# Patient Record
Sex: Female | Born: 1980 | Race: White | Hispanic: No | Marital: Married | Smoking: Former smoker
Health system: Southern US, Community
[De-identification: ages and names within clinical notes are randomized; demographics above are authoritative.]

## PROBLEM LIST (undated history)

## (undated) ENCOUNTER — Inpatient Hospital Stay (HOSPITAL_COMMUNITY): Payer: Self-pay

## (undated) DIAGNOSIS — I209 Angina pectoris, unspecified: Secondary | ICD-10-CM

## (undated) DIAGNOSIS — R51 Headache: Secondary | ICD-10-CM

## (undated) DIAGNOSIS — F419 Anxiety disorder, unspecified: Secondary | ICD-10-CM

## (undated) DIAGNOSIS — E282 Polycystic ovarian syndrome: Secondary | ICD-10-CM

## (undated) DIAGNOSIS — R519 Headache, unspecified: Secondary | ICD-10-CM

## (undated) DIAGNOSIS — Z8619 Personal history of other infectious and parasitic diseases: Secondary | ICD-10-CM

## (undated) DIAGNOSIS — F32A Depression, unspecified: Secondary | ICD-10-CM

## (undated) DIAGNOSIS — F329 Major depressive disorder, single episode, unspecified: Secondary | ICD-10-CM

## (undated) DIAGNOSIS — K589 Irritable bowel syndrome without diarrhea: Secondary | ICD-10-CM

## (undated) DIAGNOSIS — Z87448 Personal history of other diseases of urinary system: Secondary | ICD-10-CM

## (undated) DIAGNOSIS — O09529 Supervision of elderly multigravida, unspecified trimester: Secondary | ICD-10-CM

## (undated) HISTORY — DX: Irritable bowel syndrome, unspecified: K58.9

## (undated) HISTORY — DX: Personal history of other diseases of urinary system: Z87.448

## (undated) HISTORY — PX: NO PAST SURGERIES: SHX2092

## (undated) HISTORY — DX: Headache: R51

## (undated) HISTORY — DX: Headache, unspecified: R51.9

## (undated) HISTORY — DX: Supervision of elderly multigravida, unspecified trimester: O09.529

## (undated) HISTORY — DX: Personal history of other infectious and parasitic diseases: Z86.19

## (undated) HISTORY — DX: Polycystic ovarian syndrome: E28.2

---

## 2001-07-12 ENCOUNTER — Other Ambulatory Visit: Admission: RE | Admit: 2001-07-12 | Discharge: 2001-07-12 | Payer: Self-pay | Admitting: Obstetrics and Gynecology

## 2003-10-25 ENCOUNTER — Emergency Department (HOSPITAL_COMMUNITY): Admission: AD | Admit: 2003-10-25 | Discharge: 2003-10-25 | Payer: Self-pay | Admitting: Family Medicine

## 2003-12-10 ENCOUNTER — Emergency Department (HOSPITAL_COMMUNITY): Admission: EM | Admit: 2003-12-10 | Discharge: 2003-12-10 | Payer: Self-pay | Admitting: Emergency Medicine

## 2004-12-07 ENCOUNTER — Other Ambulatory Visit: Admission: RE | Admit: 2004-12-07 | Discharge: 2004-12-07 | Payer: Self-pay | Admitting: Obstetrics and Gynecology

## 2005-06-13 ENCOUNTER — Inpatient Hospital Stay (HOSPITAL_COMMUNITY): Admission: AD | Admit: 2005-06-13 | Discharge: 2005-06-13 | Payer: Self-pay | Admitting: Obstetrics and Gynecology

## 2005-06-19 ENCOUNTER — Inpatient Hospital Stay (HOSPITAL_COMMUNITY): Admission: AD | Admit: 2005-06-19 | Discharge: 2005-06-22 | Payer: Self-pay | Admitting: Obstetrics and Gynecology

## 2007-02-19 ENCOUNTER — Emergency Department (HOSPITAL_COMMUNITY): Admission: EM | Admit: 2007-02-19 | Discharge: 2007-02-19 | Payer: Self-pay | Admitting: Family Medicine

## 2007-02-20 ENCOUNTER — Emergency Department (HOSPITAL_COMMUNITY): Admission: EM | Admit: 2007-02-20 | Discharge: 2007-02-20 | Payer: Self-pay | Admitting: Emergency Medicine

## 2008-03-04 ENCOUNTER — Emergency Department (HOSPITAL_COMMUNITY): Admission: EM | Admit: 2008-03-04 | Discharge: 2008-03-04 | Payer: Self-pay | Admitting: Family Medicine

## 2009-02-02 ENCOUNTER — Emergency Department (HOSPITAL_COMMUNITY): Admission: EM | Admit: 2009-02-02 | Discharge: 2009-02-02 | Payer: Self-pay | Admitting: Emergency Medicine

## 2010-05-08 ENCOUNTER — Emergency Department (HOSPITAL_COMMUNITY): Admission: EM | Admit: 2010-05-08 | Discharge: 2010-05-08 | Payer: Self-pay | Admitting: Emergency Medicine

## 2010-10-14 LAB — POCT PREGNANCY, URINE: Preg Test, Ur: NEGATIVE

## 2010-11-07 LAB — CULTURE, ROUTINE-ABSCESS

## 2010-12-17 NOTE — Discharge Summary (Signed)
Kathleen [name], Kathleen [name]           ACCOUNT NO.:  [id number]   MEDICAL RECORD NO.:  [id number]          PATIENT TYPE:  INP   LOCATION:  9136                          FACILITY:  [facility]   PHYSICIAN:  Zenaida Niece, M.D.DATE OF BIRTH:  1980-08-14   DATE OF ADMISSION:  06/19/2005  DATE OF DISCHARGE:                                 DISCHARGE SUMMARY   ADMISSION DIAGNOSES:  1.  Intrauterine pregnancy at 39 weeks.  2.  Gestational hypertension.  3.  Hypokalemia.   DISCHARGE DIAGNOSES:  1.  Intrauterine pregnancy at 39 weeks.  2.  Gestational hypertension.  3.  Hypokalemia.   PROCEDURES:  On June 20, 2005, she had a spontaneous vaginal delivery.   HISTORY AND PHYSICAL:  This is a 30 year old white female gravida 2 para 0-0-  1-0 with an EGA of [redacted] weeks who presents with a complaint of regular  contractions. Initial evaluation revealed her to be 2+, 80, and -2, with  palpable membranes and regular contractions. Prenatal care complicated by  first trimester nausea and vomiting and a history of migraine headaches and  anxiety. She has also had recent elevated blood pressures with normal labs  and no other significant symptoms. Prenatal labs:  Blood type is O positive  with a negative antibody screen, RPR nonreactive, rubella equivocal,  hepatitis B surface antigen negative, HIV negative, gonorrhea and chlamydia  negative, triple screen is normal, 1-hour Glucola is 107, group B strep is  negative. Past OB history:  One spontaneous abortion. Past medical history:  Migraines and anxiety. Allergies to PHENOBARBITAL. Physical exam:  She is  afebrile with stable vital signs except for blood pressures 140s over 90s to  100. Fetal heart tracing is reactive with contractions every 2-3 minutes.  Abdomen is gravid and nontender. Cervix again, on Dr. Berenda Morale first  exam, she is 2+, 80, -2, and vertex.   HOSPITAL COURSE:  The patient was admitted and PIH labs were checked. The  only  significant abnormality on her labs was a hypokalemia of 2.5. She  continued to contract and on my first exam on the morning of November 20 she  was 4, 90, and 0, and amniotomy was performed for augmentation. She had also  previously been started on Pitocin for augmentation. She did receive an  epidural. She progressed slowly throughout the day but did eventually reach  complete, pushed well, and on the evening of November 20 had a vaginal  delivery of a viable female infant with Apgars of 7 and 9 that weighed 8  pounds 2 ounces. Placenta delivered spontaneous and two remnants of  membranes were also removed. She had a small laceration at 8 o'clock  repaired with 3-0 Vicryl. She had bilateral labial abrasions which were  hemostatic and not repaired and estimated blood loss was 500 cc. Postpartum  she had no significant complications. Predelivery hemoglobin 11.2,  postdelivery 10.0. Her hypokalemia persisted with a value of 2.3 on  postpartum day #1. She was then started on potassium replacement therapy,  but on postpartum day #2 potassium was still low at 2.7. The plan is to  continue potassium replacement  therapy and recheck this in a week. If her  hypokalemia persists, she will be referred to an endocrinologist for  evaluation. She was felt to be stable enough for discharge at this time.   DISCHARGE INSTRUCTIONS:  Regular diet, pelvic rest, follow up in 6 weeks.  She is also to follow up in 1 week to check potassium level. Medications are  over-the-counter ibuprofen as needed and K-Dur 20 mEq #2 p.o. b.i.d., and  she is given our discharge pamphlet.      Zenaida Niece, M.D.  Electronically Signed     TDM/MEDQ  D:  06/22/2005  T:  06/22/2005  Job:  [id number]

## 2011-04-29 LAB — CBC
Hemoglobin: 15.3 — ABNORMAL HIGH
MCV: 87.4
RBC: 5.21 — ABNORMAL HIGH
WBC: 7.9

## 2011-04-29 LAB — DIFFERENTIAL
Eosinophils Absolute: 0.4
Lymphs Abs: 1.8
Monocytes Absolute: 0.6
Monocytes Relative: 8
Neutrophils Relative %: 65

## 2011-05-16 LAB — I-STAT 8, (EC8 V) (CONVERTED LAB)
BUN: 6
Chloride: 107
Glucose, Bld: 69 — ABNORMAL LOW
Potassium: 4.2
pH, Ven: 7.346 — ABNORMAL HIGH

## 2011-05-16 LAB — DIFFERENTIAL
Basophils Absolute: 0.1
Basophils Relative: 1
Eosinophils Absolute: 0.2
Eosinophils Relative: 3
Lymphocytes Relative: 38
Lymphs Abs: 3.1
Monocytes Absolute: 0.5
Monocytes Relative: 6
Neutro Abs: 4.2
Neutrophils Relative %: 52

## 2011-05-16 LAB — SEDIMENTATION RATE: Sed Rate: 3

## 2011-05-16 LAB — POCT URINALYSIS DIP (DEVICE)
Protein, ur: NEGATIVE
Specific Gravity, Urine: 1.015
Urobilinogen, UA: 0.2

## 2011-05-16 LAB — CBC
HCT: 41.8
Hemoglobin: 14.2
MCHC: 33.9
MCV: 85.8
Platelets: 219
RBC: 4.88
RDW: 11.9
WBC: 8

## 2011-08-30 ENCOUNTER — Telehealth: Payer: Self-pay

## 2011-08-30 NOTE — Telephone Encounter (Signed)
.  [facility] PT WILL BE PICKING UP A COPY OF HER IMMUNIZATION RECORDS, WAS TOLD IT MAY BE 24 TO 48 HR.S PLEASE CALL PT AT [phone]

## 2014-04-22 ENCOUNTER — Ambulatory Visit: Payer: Self-pay

## 2015-02-12 LAB — OB RESULTS CONSOLE HIV ANTIBODY (ROUTINE TESTING): HIV: NONREACTIVE

## 2015-02-12 LAB — OB RESULTS CONSOLE RPR: RPR: NONREACTIVE

## 2015-02-12 LAB — OB RESULTS CONSOLE GC/CHLAMYDIA
CHLAMYDIA, DNA PROBE: NEGATIVE
Gonorrhea: NEGATIVE

## 2015-02-12 LAB — OB RESULTS CONSOLE RUBELLA ANTIBODY, IGM: RUBELLA: IMMUNE

## 2015-02-12 LAB — OB RESULTS CONSOLE ANTIBODY SCREEN: ANTIBODY SCREEN: NEGATIVE

## 2015-02-12 LAB — OB RESULTS CONSOLE ABO/RH: RH TYPE: POSITIVE

## 2015-02-12 LAB — OB RESULTS CONSOLE HEPATITIS B SURFACE ANTIGEN: HEP B S AG: NEGATIVE

## 2015-07-07 ENCOUNTER — Other Ambulatory Visit (HOSPITAL_COMMUNITY): Payer: Self-pay | Admitting: Obstetrics and Gynecology

## 2015-07-07 DIAGNOSIS — R1011 Right upper quadrant pain: Secondary | ICD-10-CM

## 2015-07-08 ENCOUNTER — Ambulatory Visit (HOSPITAL_COMMUNITY)
Admission: RE | Admit: 2015-07-08 | Discharge: 2015-07-08 | Disposition: A | Discharge status: Home / Self Care | Payer: Medicaid Other | Source: Ambulatory Visit | Attending: Obstetrics and Gynecology | Admitting: Obstetrics and Gynecology

## 2015-07-08 DIAGNOSIS — R1011 Right upper quadrant pain: Secondary | ICD-10-CM | POA: Diagnosis present

## 2015-07-08 DIAGNOSIS — Z3A30 30 weeks gestation of pregnancy: Secondary | ICD-10-CM | POA: Diagnosis not present

## 2015-07-08 DIAGNOSIS — O26893 Other specified pregnancy related conditions, third trimester: Secondary | ICD-10-CM | POA: Insufficient documentation

## 2015-08-02 NOTE — L&D Delivery Note (Signed)
Delivery Note At 6:43 PM a viable female was delivered via Vaginal, Spontaneous Delivery (Presentation: Left Occiput Anterior).  APGAR: 8, 9; weight pending.   Placenta status: Intact, Spontaneous.  Cord:  with the following complications: None.   Anesthesia: Epidural  Episiotomy: None Lacerations: Labial Suture Repair: none Est. Blood Loss (mL): 200  Mom to postpartum.  Baby to Couplet care / Skin to Skin.  Kahla Risdon D 09/10/2015, 7:04 PM

## 2015-08-14 ENCOUNTER — Encounter (HOSPITAL_COMMUNITY): Payer: Self-pay

## 2015-08-14 ENCOUNTER — Inpatient Hospital Stay (HOSPITAL_COMMUNITY)
Admission: AD | Admit: 2015-08-14 | Discharge: 2015-08-14 | Disposition: A | Discharge status: Home / Self Care | Payer: Medicaid Other | Source: Ambulatory Visit | Attending: Obstetrics and Gynecology | Admitting: Obstetrics and Gynecology

## 2015-08-14 ENCOUNTER — Inpatient Hospital Stay (HOSPITAL_COMMUNITY): Payer: Medicaid Other

## 2015-08-14 DIAGNOSIS — Z3A35 35 weeks gestation of pregnancy: Secondary | ICD-10-CM | POA: Insufficient documentation

## 2015-08-14 DIAGNOSIS — O9989 Other specified diseases and conditions complicating pregnancy, childbirth and the puerperium: Secondary | ICD-10-CM | POA: Diagnosis not present

## 2015-08-14 DIAGNOSIS — R0602 Shortness of breath: Secondary | ICD-10-CM | POA: Diagnosis not present

## 2015-08-14 DIAGNOSIS — R079 Chest pain, unspecified: Secondary | ICD-10-CM | POA: Insufficient documentation

## 2015-08-14 DIAGNOSIS — O26893 Other specified pregnancy related conditions, third trimester: Secondary | ICD-10-CM | POA: Diagnosis present

## 2015-08-14 DIAGNOSIS — R000 Tachycardia, unspecified: Secondary | ICD-10-CM | POA: Insufficient documentation

## 2015-08-14 HISTORY — DX: Anxiety disorder, unspecified: F41.9

## 2015-08-14 HISTORY — DX: Major depressive disorder, single episode, unspecified: F32.9

## 2015-08-14 HISTORY — DX: Angina pectoris, unspecified: I20.9

## 2015-08-14 HISTORY — DX: Depression, unspecified: F32.A

## 2015-08-14 LAB — URINALYSIS, ROUTINE W REFLEX MICROSCOPIC
BILIRUBIN URINE: NEGATIVE
GLUCOSE, UA: NEGATIVE mg/dL
HGB URINE DIPSTICK: NEGATIVE
Ketones, ur: NEGATIVE mg/dL
Leukocytes, UA: NEGATIVE
Nitrite: NEGATIVE
PH: 5.5 (ref 5.0–8.0)
Protein, ur: NEGATIVE mg/dL
SPECIFIC GRAVITY, URINE: 1.015 (ref 1.005–1.030)

## 2015-08-14 LAB — PROTEIN / CREATININE RATIO, URINE
CREATININE, URINE: 43 mg/dL
PROTEIN CREATININE RATIO: 0.14 mg/mg{creat} (ref 0.00–0.15)
TOTAL PROTEIN, URINE: 6 mg/dL

## 2015-08-14 LAB — COMPREHENSIVE METABOLIC PANEL
ALK PHOS: 137 U/L — AB (ref 38–126)
ALT: 12 U/L — ABNORMAL LOW (ref 14–54)
ANION GAP: 10 (ref 5–15)
AST: 22 U/L (ref 15–41)
Albumin: 3.2 g/dL — ABNORMAL LOW (ref 3.5–5.0)
BUN: 9 mg/dL (ref 6–20)
CALCIUM: 9 mg/dL (ref 8.9–10.3)
CHLORIDE: 108 mmol/L (ref 101–111)
CO2: 19 mmol/L — AB (ref 22–32)
Creatinine, Ser: 0.58 mg/dL (ref 0.44–1.00)
Glucose, Bld: 111 mg/dL — ABNORMAL HIGH (ref 65–99)
Potassium: 3.6 mmol/L (ref 3.5–5.1)
SODIUM: 137 mmol/L (ref 135–145)
Total Bilirubin: 0.3 mg/dL (ref 0.3–1.2)
Total Protein: 7.1 g/dL (ref 6.5–8.1)

## 2015-08-14 LAB — CBC WITH DIFFERENTIAL/PLATELET
Basophils Absolute: 0 10*3/uL (ref 0.0–0.1)
Basophils Relative: 0 %
EOS ABS: 0.1 10*3/uL (ref 0.0–0.7)
EOS PCT: 1 %
HCT: 32.1 % — ABNORMAL LOW (ref 36.0–46.0)
Hemoglobin: 10.5 g/dL — ABNORMAL LOW (ref 12.0–15.0)
LYMPHS ABS: 2.7 10*3/uL (ref 0.7–4.0)
Lymphocytes Relative: 24 %
MCH: 27.1 pg (ref 26.0–34.0)
MCHC: 32.7 g/dL (ref 30.0–36.0)
MCV: 82.7 fL (ref 78.0–100.0)
MONOS PCT: 5 %
Monocytes Absolute: 0.6 10*3/uL (ref 0.1–1.0)
Neutro Abs: 8 10*3/uL — ABNORMAL HIGH (ref 1.7–7.7)
Neutrophils Relative %: 70 %
PLATELETS: 243 10*3/uL (ref 150–400)
RBC: 3.88 MIL/uL (ref 3.87–5.11)
RDW: 13.3 % (ref 11.5–15.5)
WBC: 11.4 10*3/uL — ABNORMAL HIGH (ref 4.0–10.5)

## 2015-08-14 LAB — LACTATE DEHYDROGENASE: LDH: 131 U/L (ref 98–192)

## 2015-08-14 LAB — URIC ACID: Uric Acid, Serum: 3.5 mg/dL (ref 2.3–6.6)

## 2015-08-14 LAB — MRSA PCR SCREENING: MRSA by PCR: NEGATIVE

## 2015-08-14 MED ORDER — LABETALOL HCL 5 MG/ML IV SOLN: 20.0000 mg | INTRAVENOUS | Status: DC | PRN

## 2015-08-14 MED ORDER — LACTATED RINGERS IV BOLUS (SEPSIS)
500.0000 mL | Freq: Once | INTRAVENOUS | Status: AC
Start: 2015-08-14 — End: 2015-08-14
  Administered 2015-08-14: 1000 mL via INTRAVENOUS

## 2015-08-14 MED ORDER — IOHEXOL 350 MG/ML SOLN
100.0000 mL | Freq: Once | INTRAVENOUS | Status: AC | PRN
  Administered 2015-08-14: 100 mL via INTRAVENOUS

## 2015-08-14 MED ORDER — HYDRALAZINE HCL 20 MG/ML IJ SOLN: 10.0000 mg | Freq: Once | INTRAMUSCULAR | Status: DC | PRN

## 2015-08-14 NOTE — MAU Note (Signed)
Patient presents with SOB, chest tightness, rapid heart rate, patient checked heart rate was 130s.

## 2015-08-14 NOTE — Discharge Instructions (Signed)
Holter Monitoring A Holter monitor is a small device that is used to detect abnormal heart rhythms. It clips to your clothing and is connected by wires to flat, sticky disks (electrodes) that attach to your chest. It is worn continuously for 24-48 hours. HOME CARE INSTRUCTIONS  Wear your Holter monitor at all times, even while exercising and sleeping, for as long as directed by your health care provider.  Make sure that the Holter monitor is safely clipped to your clothing or close to your body as recommended by your health care provider.  Do not get the monitor or wires wet.  Do not put body lotion or moisturizer on your chest.  Keep your skin clean.  Keep a diary of your daily activities, such as walking and doing chores. If you feel that your heartbeat is abnormal or that your heart is fluttering or skipping a beat:  Record what you are doing when it happens.  Record what time of day the symptoms occur.  Return your Holter monitor as directed by your health care provider.  Keep all follow-up visits as directed by your health care provider. This is important. SEEK IMMEDIATE MEDICAL CARE IF:  You feel lightheaded or you faint.  You have trouble breathing.  You feel pain in your chest, upper arm, or jaw.  You feel sick to your stomach and your skin is pale, cool, or damp.  You heartbeat feels unusual or abnormal.   This information is not intended to replace advice given to you by your health care provider. Make sure you discuss any questions you have with your health care provider.   Document Released: 04/15/2004 Document Revised: 08/08/2014 Document Reviewed: 02/24/2014 Elsevier Interactive Patient Education 2016 [employer]  

## 2015-08-14 NOTE — MAU Provider Note (Signed)
  History     CSN: [id number]  Arrival date and time: 08/14/15 1646   First Provider Initiated Contact with Patient 08/14/15 1729      Chief Complaint  Patient presents with  . Tachycardia  . Chest Pain  . Shortness of Breath   HPI Kathleen [name] 35 y.o. G2P1001 @[redacted]w[redacted]d  presents to MAU complaining of increased heart rate and SOB.  She has been having episodes of heart racing several times a day for the past 3 days.  She checked her pulse today and found it to be 130s.  This comes on during periods of rest.  It occurs a few times each day lasting around 10-15 minutes each time although this time it seemed to go on for 30 minutes prior to her arrival.  She walks for an hour every day for exercise and it never occurs during that time.  She feels chest tightness with SOB but no other chest pain.  She called the office and was advised to come here.  She notes good fetal movements.  She denies HA, vision change, epigastric pain, swelling.   OB History    Gravida Para Term Preterm AB TAB SAB Ectopic Multiple Living   2 1 1       1       Past Medical History  Diagnosis Date  . Depression   . Anxiety   . Anginal pain (HCC)     History reviewed. No pertinent past surgical history.  History reviewed. No pertinent family history.  Social History  Substance Use Topics  . Smoking status: None  . Smokeless tobacco: None  . Alcohol Use: None    Allergies:  Allergies  Allergen Reactions  . Phenobarbital Hives    No prescriptions prior to admission    ROS Pertinent ROS in HPI.  All other systems are negative.   Physical Exam   Blood pressure 128/91, pulse 122, temperature 97.9 F (36.6 C), temperature source Oral, resp. rate 20, height 5\' 9"  (1.753 m), weight 162 lb 12.8 oz (73.846 kg), SpO2 100 %.  Physical Exam  Constitutional: She is oriented to person, place, and time. She appears well-developed and well-nourished. No distress.  HENT:  Head: Normocephalic and  atraumatic.  Eyes: Conjunctivae and EOM are normal.  Neck: Normal range of motion. Neck supple.  Cardiovascular: Regular rhythm and normal heart sounds.   Respiratory: Effort normal. No respiratory distress.  GI: Soft. Bowel sounds are normal. She exhibits no distension.  Musculoskeletal: Normal range of motion.  Neurological: She is alert and oriented to person, place, and time.  Skin: Skin is warm and dry.  Psychiatric: She has a normal mood and affect. Her behavior is normal.    MAU Course  Procedures  MDM EKG ordered - normal Labs ordered - normal without sign of anemia/dehydration/infection O2 Sats stable Discussed with Dr. Mindi SlickerBanga whom agreed to obtain CT to rule out PE CT negative for PE Pt feeling completely improved with no complaints whatsoever Dr. Mindi SlickerBanga agreeable to discharge with f/u in office  Assessment and Plan  A:  1. Shortness of breath during pregnancy   2. Tachycardia    P: Discharge to home F/u in clinic - may consider cardiology consult Patient may return to MAU as needed or if her condition were to change or worsen   Bertram DenverKaren E Teague Clark 08/14/2015, 5:29 PM

## 2015-08-18 LAB — OB RESULTS CONSOLE GBS: GBS: POSITIVE

## 2015-09-08 ENCOUNTER — Encounter (HOSPITAL_COMMUNITY): Payer: Self-pay | Admitting: *Deleted

## 2015-09-08 ENCOUNTER — Telehealth (HOSPITAL_COMMUNITY): Payer: Self-pay | Admitting: *Deleted

## 2015-09-08 NOTE — Telephone Encounter (Signed)
Preadmission screen  

## 2015-09-10 ENCOUNTER — Encounter (HOSPITAL_COMMUNITY): Payer: Self-pay

## 2015-09-10 ENCOUNTER — Inpatient Hospital Stay (HOSPITAL_COMMUNITY)
Admission: AD | Admit: 2015-09-10 | Discharge: 2015-09-12 | DRG: 775 | Disposition: A | Discharge status: Home / Self Care | Payer: Medicaid Other | Source: Ambulatory Visit | Attending: Obstetrics and Gynecology | Admitting: Obstetrics and Gynecology

## 2015-09-10 ENCOUNTER — Inpatient Hospital Stay (HOSPITAL_COMMUNITY): Payer: Medicaid Other | Admitting: Anesthesiology

## 2015-09-10 DIAGNOSIS — Z3A39 39 weeks gestation of pregnancy: Secondary | ICD-10-CM

## 2015-09-10 DIAGNOSIS — O99344 Other mental disorders complicating childbirth: Secondary | ICD-10-CM | POA: Diagnosis present

## 2015-09-10 DIAGNOSIS — F418 Other specified anxiety disorders: Secondary | ICD-10-CM | POA: Diagnosis present

## 2015-09-10 DIAGNOSIS — O99824 Streptococcus B carrier state complicating childbirth: Secondary | ICD-10-CM | POA: Diagnosis present

## 2015-09-10 DIAGNOSIS — Z87891 Personal history of nicotine dependence: Secondary | ICD-10-CM | POA: Diagnosis not present

## 2015-09-10 DIAGNOSIS — IMO0001 Reserved for inherently not codable concepts without codable children: Secondary | ICD-10-CM

## 2015-09-10 LAB — CBC
HEMATOCRIT: 35.4 % — AB (ref 36.0–46.0)
HEMOGLOBIN: 11.3 g/dL — AB (ref 12.0–15.0)
MCH: 26 pg (ref 26.0–34.0)
MCHC: 31.9 g/dL (ref 30.0–36.0)
MCV: 81.4 fL (ref 78.0–100.0)
Platelets: 240 10*3/uL (ref 150–400)
RBC: 4.35 MIL/uL (ref 3.87–5.11)
RDW: 14.5 % (ref 11.5–15.5)
WBC: 10.9 10*3/uL — ABNORMAL HIGH (ref 4.0–10.5)

## 2015-09-10 LAB — TYPE AND SCREEN
ABO/RH(D): O POS
ANTIBODY SCREEN: NEGATIVE

## 2015-09-10 LAB — MRSA PCR SCREENING: MRSA BY PCR: NEGATIVE

## 2015-09-10 LAB — ABO/RH: ABO/RH(D): O POS

## 2015-09-10 MED ORDER — ACETAMINOPHEN 325 MG PO TABS
650.0000 mg | ORAL_TABLET | ORAL | Status: DC | PRN
  Administered 2015-09-10 – 2015-09-11 (×2): 650 mg via ORAL
  Filled 2015-09-10 (×2): qty 2

## 2015-09-10 MED ORDER — PRENATAL MULTIVITAMIN CH
1.0000 | ORAL_TABLET | Freq: Every day | ORAL | Status: DC
  Administered 2015-09-11: 1 via ORAL
  Filled 2015-09-10: qty 1

## 2015-09-10 MED ORDER — LANOLIN HYDROUS EX OINT: TOPICAL_OINTMENT | CUTANEOUS | Status: DC | PRN

## 2015-09-10 MED ORDER — PHENYLEPHRINE 40 MCG/ML (10ML) SYRINGE FOR IV PUSH (FOR BLOOD PRESSURE SUPPORT)
80.0000 ug | PREFILLED_SYRINGE | INTRAVENOUS | Status: DC | PRN
  Filled 2015-09-10: qty 20
  Filled 2015-09-10: qty 2

## 2015-09-10 MED ORDER — LIDOCAINE HCL (PF) 1 % IJ SOLN
30.0000 mL | INTRAMUSCULAR | Status: DC | PRN
  Filled 2015-09-10: qty 30

## 2015-09-10 MED ORDER — OXYTOCIN BOLUS FROM INFUSION: 500.0000 mL | INTRAVENOUS | Status: DC

## 2015-09-10 MED ORDER — DIPHENHYDRAMINE HCL 50 MG/ML IJ SOLN: 12.5000 mg | INTRAMUSCULAR | Status: DC | PRN

## 2015-09-10 MED ORDER — METHYLERGONOVINE MALEATE 0.2 MG PO TABS: 0.2000 mg | ORAL_TABLET | ORAL | Status: DC | PRN

## 2015-09-10 MED ORDER — WITCH HAZEL-GLYCERIN EX PADS
1.0000 "application " | MEDICATED_PAD | CUTANEOUS | Status: DC | PRN
  Administered 2015-09-10: 1 via TOPICAL

## 2015-09-10 MED ORDER — OXYCODONE HCL 5 MG PO TABS: 5.0000 mg | ORAL_TABLET | ORAL | Status: DC | PRN

## 2015-09-10 MED ORDER — MEASLES, MUMPS & RUBELLA VAC ~~LOC~~ INJ: 0.5000 mL | INJECTION | Freq: Once | SUBCUTANEOUS | Status: DC

## 2015-09-10 MED ORDER — PENICILLIN G POTASSIUM 5000000 UNITS IJ SOLR
5.0000 10*6.[IU] | Freq: Once | INTRAVENOUS | Status: AC
  Administered 2015-09-10: 5 10*6.[IU] via INTRAVENOUS
  Filled 2015-09-10: qty 5

## 2015-09-10 MED ORDER — BENZOCAINE-MENTHOL 20-0.5 % EX AERO
1.0000 | INHALATION_SPRAY | CUTANEOUS | Status: DC | PRN
Start: 2015-09-10 — End: 2015-09-12
  Administered 2015-09-10 – 2015-09-11 (×2): 1 via TOPICAL
  Filled 2015-09-10 (×2): qty 56

## 2015-09-10 MED ORDER — METHYLERGONOVINE MALEATE 0.2 MG/ML IJ SOLN: 0.2000 mg | INTRAMUSCULAR | Status: DC | PRN

## 2015-09-10 MED ORDER — LACTATED RINGERS IV SOLN: 500.0000 mL | INTRAVENOUS | Status: DC | PRN

## 2015-09-10 MED ORDER — ONDANSETRON HCL 4 MG PO TABS
4.0000 mg | ORAL_TABLET | ORAL | Status: DC | PRN
Start: 2015-09-10 — End: 2015-09-12

## 2015-09-10 MED ORDER — DIBUCAINE 1 % RE OINT
1.0000 "application " | TOPICAL_OINTMENT | RECTAL | Status: DC | PRN
  Administered 2015-09-10: 1 via RECTAL
  Filled 2015-09-10: qty 28

## 2015-09-10 MED ORDER — CITRIC ACID-SODIUM CITRATE 334-500 MG/5ML PO SOLN: 30.0000 mL | ORAL | Status: DC | PRN

## 2015-09-10 MED ORDER — SENNOSIDES-DOCUSATE SODIUM 8.6-50 MG PO TABS
2.0000 | ORAL_TABLET | ORAL | Status: DC
  Administered 2015-09-10 – 2015-09-11 (×2): 2 via ORAL
  Filled 2015-09-10 (×2): qty 2

## 2015-09-10 MED ORDER — SODIUM BICARBONATE 8.4 % IV SOLN
INTRAVENOUS | Status: DC | PRN
  Administered 2015-09-10: 5 mL via EPIDURAL

## 2015-09-10 MED ORDER — EPHEDRINE 5 MG/ML INJ
10.0000 mg | INTRAVENOUS | Status: DC | PRN
  Filled 2015-09-10: qty 2

## 2015-09-10 MED ORDER — OXYCODONE HCL 5 MG PO TABS: 10.0000 mg | ORAL_TABLET | ORAL | Status: DC | PRN

## 2015-09-10 MED ORDER — IBUPROFEN 600 MG PO TABS
600.0000 mg | ORAL_TABLET | Freq: Four times a day (QID) | ORAL | Status: DC
  Administered 2015-09-10 – 2015-09-12 (×6): 600 mg via ORAL
  Filled 2015-09-10 (×6): qty 1

## 2015-09-10 MED ORDER — BUTORPHANOL TARTRATE 1 MG/ML IJ SOLN: 1.0000 mg | INTRAMUSCULAR | Status: DC | PRN

## 2015-09-10 MED ORDER — ZOLPIDEM TARTRATE 5 MG PO TABS: 5.0000 mg | ORAL_TABLET | Freq: Every evening | ORAL | Status: DC | PRN

## 2015-09-10 MED ORDER — LACTATED RINGERS IV SOLN
INTRAVENOUS | Status: DC
  Administered 2015-09-10 (×2): via INTRAVENOUS

## 2015-09-10 MED ORDER — LACTATED RINGERS IV SOLN
2.5000 [IU]/h | INTRAVENOUS | Status: DC
  Administered 2015-09-10: 2.5 [IU]/h via INTRAVENOUS
  Filled 2015-09-10: qty 4

## 2015-09-10 MED ORDER — SIMETHICONE 80 MG PO CHEW: 80.0000 mg | CHEWABLE_TABLET | ORAL | Status: DC | PRN

## 2015-09-10 MED ORDER — OXYCODONE-ACETAMINOPHEN 5-325 MG PO TABS: 2.0000 | ORAL_TABLET | ORAL | Status: DC | PRN

## 2015-09-10 MED ORDER — DIPHENHYDRAMINE HCL 25 MG PO CAPS: 25.0000 mg | ORAL_CAPSULE | Freq: Four times a day (QID) | ORAL | Status: DC | PRN

## 2015-09-10 MED ORDER — ONDANSETRON HCL 4 MG/2ML IJ SOLN: 4.0000 mg | INTRAMUSCULAR | Status: DC | PRN

## 2015-09-10 MED ORDER — MAGNESIUM HYDROXIDE 400 MG/5ML PO SUSP
30.0000 mL | ORAL | Status: DC | PRN
Start: 2015-09-10 — End: 2015-09-12

## 2015-09-10 MED ORDER — BUPROPION HCL ER (XL) 150 MG PO TB24
150.0000 mg | ORAL_TABLET | Freq: Every day | ORAL | Status: DC
  Administered 2015-09-11 – 2015-09-12 (×2): 150 mg via ORAL
  Filled 2015-09-10 (×2): qty 1

## 2015-09-10 MED ORDER — ONDANSETRON HCL 4 MG/2ML IJ SOLN: 4.0000 mg | Freq: Four times a day (QID) | INTRAMUSCULAR | Status: DC | PRN

## 2015-09-10 MED ORDER — PENICILLIN G POTASSIUM 5000000 UNITS IJ SOLR
2.5000 10*6.[IU] | INTRAVENOUS | Status: DC
  Administered 2015-09-10: 2.5 10*6.[IU] via INTRAVENOUS
  Filled 2015-09-10 (×4): qty 2.5

## 2015-09-10 MED ORDER — OXYCODONE-ACETAMINOPHEN 5-325 MG PO TABS: 1.0000 | ORAL_TABLET | ORAL | Status: DC | PRN

## 2015-09-10 MED ORDER — TETANUS-DIPHTH-ACELL PERTUSSIS 5-2.5-18.5 LF-MCG/0.5 IM SUSP: 0.5000 mL | Freq: Once | INTRAMUSCULAR | Status: DC

## 2015-09-10 MED ORDER — LIDOCAINE HCL (PF) 1 % IJ SOLN
INTRAMUSCULAR | Status: DC | PRN
  Administered 2015-09-10: 5 mL via EPIDURAL
  Administered 2015-09-10: 2 mL via EPIDURAL
  Administered 2015-09-10: 3 mL via EPIDURAL

## 2015-09-10 MED ORDER — FENTANYL 2.5 MCG/ML BUPIVACAINE 1/10 % EPIDURAL INFUSION (WH - ANES)
14.0000 mL/h | INTRAMUSCULAR | Status: DC | PRN
  Administered 2015-09-10: 14 mL/h via EPIDURAL
  Filled 2015-09-10: qty 125

## 2015-09-10 NOTE — Consults (Signed)
  Anesthesia Pain Consult Note  Patient: Kathleen [name], 35 y.o., female  Consult Requested by: Lavina Hamman, MD  Reason for Consult: CRNA Pain Rounds  Level of Consciousness: alert  Pain: 3 /10 , Pain GOAL: 6-7  Last Vitals:  Filed Vitals:   09/10/15 1142 09/10/15 1228  BP: 143/95 137/79  Pulse: 95 90  Temp:    Resp: 16 16    Plan: Epidural infusion for pain control.   Dineen Conradt L 09/10/2015

## 2015-09-10 NOTE — Anesthesia Procedure Notes (Signed)
Epidural Patient location during procedure: OB  Staffing Anesthesiologist: Jrake Rodriquez EDWARD Performed by: anesthesiologist   Preanesthetic Checklist Completed: patient identified, pre-op evaluation, timeout performed, IV checked, risks and benefits discussed and monitors and equipment checked  Epidural Patient position: sitting Prep: DuraPrep Patient monitoring: blood pressure and continuous pulse ox Approach: midline Location: L3-L4 Injection technique: LOR air  Needle:  Needle type: Tuohy  Needle gauge: 17 G Needle length: 9 cm Needle insertion depth: 4 cm Catheter size: 19 Gauge Catheter at skin depth: 9 cm Test dose: negative and Other (1% Lidocaine)  Additional Notes Patient identified.  Risk benefits discussed including failed block, incomplete pain control, headache, nerve damage, paralysis, blood pressure changes, nausea, vomiting, reactions to medication both toxic or allergic, and postpartum back pain.  Patient expressed understanding and wished to proceed.  All questions were answered.  Sterile technique used throughout procedure and epidural site dressed with sterile barrier dressing. No paresthesia or other complications noted. The patient did not experience any signs of intravascular injection such as tinnitus or metallic taste in mouth nor signs of intrathecal spread such as rapid motor block. Please see nursing notes for vital signs. Reason for block:procedure for pain   

## 2015-09-10 NOTE — Anesthesia Preprocedure Evaluation (Signed)
Anesthesia Evaluation  Patient identified by MRN, date of birth, ID band Patient awake    Reviewed: Allergy & Precautions, NPO status , Patient's Chart, lab work & pertinent test results  History of Anesthesia Complications Negative for: history of anesthetic complications  Airway Mallampati: II  TM Distance: >3 FB Neck ROM: Full    Dental  (+) Teeth Intact, Dental Advisory Given   Pulmonary former smoker,    Pulmonary exam normal breath sounds clear to auscultation       Cardiovascular Exercise Tolerance: Good negative cardio ROS Normal cardiovascular exam Rhythm:Regular Rate:Normal     Neuro/Psych  Headaches, PSYCHIATRIC DISORDERS Anxiety Depression    GI/Hepatic negative GI ROS, Neg liver ROS,   Endo/Other  negative endocrine ROS  Renal/GU negative Renal ROS     Musculoskeletal negative musculoskeletal ROS (+)   Abdominal   Peds  Hematology negative hematology ROS (+)   Anesthesia Other Findings Day of surgery medications reviewed with the patient.  Reproductive/Obstetrics (+) Pregnancy                             Anesthesia Physical Anesthesia Plan  ASA: II  Anesthesia Plan: Epidural   Post-op Pain Management:    Induction:   Airway Management Planned:   Additional Equipment:   Intra-op Plan:   Post-operative Plan:   Informed Consent: I have reviewed the patients History and Physical, chart, labs and discussed the procedure including the risks, benefits and alternatives for the proposed anesthesia with the patient or authorized representative who has indicated his/her understanding and acceptance.   Dental advisory given  Plan Discussed with:   Anesthesia Plan Comments: (Patient identified. Risks/Benefits/Options discussed with patient including but not limited to bleeding, infection, nerve damage, paralysis, failed block, incomplete pain control, headache, blood  pressure changes, nausea, vomiting, reactions to medication both or allergic, itching and postpartum back pain. Confirmed with bedside nurse the patient's most recent platelet count. Confirmed with patient that they are not currently taking any anticoagulation, have any bleeding history or any family history of bleeding disorders. Patient expressed understanding and wished to proceed. All questions were answered. )        Anesthesia Quick Evaluation

## 2015-09-10 NOTE — H&P (Signed)
[name] is a 35 y.o. female, G3 P1011, EGA 39+ weeks with EDC 2-12 presenting for labor.  Seen in the office with ctx, last VE 1 cm, 3/70/-2 today.  Prenatal care uncomplicated.  Maternal Medical History:  Reason for admission: Contractions.   Contractions: Frequency: regular.   Perceived severity is moderate.    Fetal activity: Perceived fetal activity is normal.      OB History    Gravida Para Term Preterm AB TAB SAB Ectopic Multiple Living   SVD at term, [facility]  Past Medical History  Diagnosis Date  . Depression   . Anxiety   . Anginal pain (HCC)   . AMA (advanced maternal age) multigravida 35+   . Hx of pyelonephritis   . Headache   . Hx of varicella   . IBS (irritable bowel syndrome)   . PCOS (polycystic ovarian syndrome)    Past Surgical History  Procedure Laterality Date  . No past surgeries     Family History: family history includes Asthma in her maternal aunt; Heart disease in her paternal grandmother. Social History:  reports that she quit smoking about 5 years ago. She does not have any smokeless tobacco history on file. She reports that she does not drink alcohol. Her drug history is not on file.   Prenatal Transfer Tool  Maternal Diabetes: No Genetic Screening: Normal Maternal Ultrasounds/Referrals: Normal Fetal Ultrasounds or other Referrals:  None Maternal Substance Abuse:  No Significant Maternal Medications:  None Significant Maternal Lab Results:  Lab values include: Group B Strep positive Other Comments:  None  Review of Systems  Respiratory: Negative.   Cardiovascular: Negative.    AROM clear Dilation: 3.5 Effacement (%): 80 Station: -2 Exam by:: Calen Geister, T, MD Blood pressure 137/79, pulse 90, temperature 98.4 F (36.9 C), temperature source Axillary, resp. rate 16, height  (1.753 m), weight 77.111 kg (170 lb). Maternal Exam:  Uterine Assessment: Contraction strength is moderate.  Contraction frequency  is regular.   Abdomen: Patient reports no abdominal tenderness. Estimated fetal weight is 7 1/2 lbs.   Fetal presentation: vertex  Introitus: Normal vulva. Normal vagina.  Amniotic fluid character: clear.  Pelvis: adequate for delivery.   Cervix: Cervix evaluated by digital exam.     Fetal Exam Fetal Monitor Review: Mode: ultrasound.   Variability: moderate (6-25 bpm).   Pattern: accelerations present and no decelerations.    Fetal State Assessment: Category I - tracings are normal.     Physical Exam  Vitals reviewed. Constitutional: She appears well-developed and well-nourished.  Cardiovascular: Normal rate, regular rhythm and normal heart sounds.   No murmur heard. Respiratory: Effort normal. No respiratory distress. She has no wheezes.  GI: Soft.    Prenatal labs: ABO, Rh: --/--/O POS (02/09 1020) Antibody: NEG (02/09 1020) Rubella: Immune (07/14 0000) RPR: Nonreactive (07/14 0000)  HBsAg: Negative (07/14 0000)  HIV: Non-reactive (07/14 0000)  GBS: Positive (01/17 0000)   Assessment/Plan: IUP at 39+ weeks in early labor, +GBS.  On PCN, AROM done for augmentation, monitor progress, anticipate SVD.   Kathleen [name] D 09/10/2015, 12:55 PM

## 2015-09-11 LAB — RPR: RPR: NONREACTIVE

## 2015-09-11 NOTE — Progress Notes (Signed)
MOB was referred for history of depression/anxiety.  Referral is screened out by Clinical Social Worker because none of the following criteria appear to apply: -History of anxiety/depression during this pregnancy, or of post-partum depression. - Diagnosis of anxiety and/or depression within last 3 years or -MOB's symptoms are currently being treated with medication and/or therapy.  Please contact the Clinical Social Worker if needs arise or upon MOB request.  

## 2015-09-11 NOTE — Lactation Note (Signed)
This note was copied from a baby's chart. Lactation Consultation Note  Patient Name: Kathleen [name] [id number] Date: 09/11/2015 Reason for consult: Initial assessment   Initial consult with first time BF mom of 21 hour old infant. Infant with 8 BF for 10-30 minutes, 2 voids, and 3 stools since birth. Infant 8 lb 6.6 oz and GA 39w 4d. LATCH Scores 7-8 by bedside RN. Mom reports that she has some nipple soreness with initial latch that improves with feeding discussed using good support with feeding and importance of obtaining and maintaining deep latch throughout feeding.  Mom reports infant has been cluster feeding, discussed NL NB Feeding behavior and NB Nutritional needs. Enc mom to feed 8-12 x in 24 hours at first feeding cues. Mom has been taught to hand express by bedside RN, advised mom the importance of hand expression. Reviewed BF Basics and positioning with mom. [facility] Brochure given, informed of LC Phone #, BF Support Groups and OP Services. Gave mom BF Resources Handout to mom. Mom to call insurance company to see if they will provide a pump for mom. Mom was asking about pumping to offer bottles at a later date, enc mom to BF for a few weeks prior to pumping unless medically indicated. Enc mom to call out to desk for questions/concerns/assistance as needed.   Maternal Data Formula Feeding for Exclusion: No Has patient been taught Hand Expression?: Yes (by bedside RN) Does the patient have breastfeeding experience prior to this delivery?: No (tried a few days with first child)  Feeding    LATCH Score/Interventions                      Lactation Tools Discussed/Used WIC Program: No   Consult Status Consult Status: Follow-up Date: 09/12/15 Follow-up type: In-patient    Silas Flood Hice 09/11/2015, 4:07 PM

## 2015-09-11 NOTE — Progress Notes (Signed)
PPD #1 No problems Afeb, VSS Fundus firm, NT at U-1 Continue routine postpartum care 

## 2015-09-11 NOTE — Anesthesia Postprocedure Evaluation (Signed)
Anesthesia Post Note  Patient: Kathleen [name]  Procedure(s) Performed: * No procedures listed *  Patient location during evaluation: Mother Baby Anesthesia Type: Epidural Level of consciousness: awake and alert and oriented Pain management: pain level controlled Vital Signs Assessment: post-procedure vital signs reviewed and stable Respiratory status: spontaneous breathing Cardiovascular status: blood pressure returned to baseline Postop Assessment: no headache, no backache, patient able to bend at knees, no signs of nausea or vomiting and adequate PO intake Anesthetic complications: no    Last Vitals:  Filed Vitals:   09/10/15 2135 09/11/15 0515  BP: 132/82 110/66  Pulse: 99 91  Temp: 37.1 C 36.6 C  Resp: 18 16    Last Pain:  Filed Vitals:   09/11/15 0740  PainSc: 3                  Brisha Mccabe

## 2015-09-12 LAB — CCBB MATERNAL DONOR DRAW

## 2015-09-12 MED ORDER — BUPROPION HCL ER (XL) 150 MG PO TB24
150.0000 mg | ORAL_TABLET | Freq: Two times a day (BID) | ORAL | Status: DC
Start: 2015-09-12 — End: 2024-01-06

## 2015-09-12 MED ORDER — IBUPROFEN 600 MG PO TABS: 600.0000 mg | ORAL_TABLET | Freq: Four times a day (QID) | ORAL | Status: DC

## 2015-09-12 NOTE — Progress Notes (Addendum)
Post Partum Day 2 Subjective: no complaints and tolerating PO  Pt had pp depression and wants to increase her wellbutrin.  Objective: Blood pressure 131/78, pulse 79, temperature 97.7 F (36.5 C), temperature source Oral, resp. rate 20, height  (1.753 m), weight 77.111 kg (170 lb), SpO2 99 %, unknown if currently breastfeeding.  Physical Exam:  General: alert and cooperative Lochia: appropriate Uterine Fundus: firm    Recent Labs  09/10/15 1020  HGB 11.3*  HCT 35.4*    Assessment/Plan: Discharge home  Motrin Increase wellbutrin to  po BID   LOS: 2 days   Donnah Levert W 09/12/2015, 9:18 AM

## 2015-09-12 NOTE — Lactation Note (Signed)
This note was copied from a baby's chart. Lactation Consultation Note  Denies questions for lactation consultant.  Patient Name: Kathleen [name] ZOXWR'[id number] Date: 09/12/2015     Maternal Data    Feeding Feeding Type: Breast Fed Length of feed: 30 min  LATCH Score/Interventions Latch: Grasps breast easily, tongue down, lips flanged, rhythmical sucking. Intervention(s): Assist with latch  Audible Swallowing: A few with stimulation  Type of Nipple: Everted at rest and after stimulation  Comfort (Breast/Nipple): Filling, red/small blisters or bruises, mild/mod discomfort  Problem noted: Mild/Moderate discomfort  Hold (Positioning): Assistance needed to correctly position infant at breast and maintain latch.  LATCH Score: 7  Lactation Tools Discussed/Used     Consult Status      Soyla Dryer 09/12/2015, 10:56 AM

## 2015-09-12 NOTE — Discharge Summary (Signed)
Obstetric Discharge Summary Reason for Admission: onset of labor Prenatal Procedures: none Intrapartum Procedures: spontaneous vaginal delivery, GBS prophylaxis Postpartum Procedures: none small labial lacerations not requiring repair Complications-Operative and Postpartum: none HEMOGLOBIN  Date Value Ref Range Status  09/10/2015 11.3* 12.0 - 15.0 g/dL Final   HCT  Date Value Ref Range Status  09/10/2015 35.4* 36.0 - 46.0 % Final    Physical Exam:  General: alert and cooperative Lochia: appropriate Uterine Fundus: firm   Discharge Diagnoses: Term Pregnancy-delivered  Discharge Information: Date: 09/12/2015 Activity: pelvic rest Diet: routine Medications: Ibuprofen and wellbutrin Condition: improved Instructions: refer to practice specific booklet Discharge to: home Follow-up Information    Follow up with MEISINGER,TODD D, MD. Schedule an appointment as soon as possible for a visit in 6 weeks.   Specialty:  Obstetrics and Gynecology   Why:  postpartum   Contact information:   [address], [address] [place] [address] [phone]       Newborn Data: Live born female  Birth Weight: 8 lb 7.1 oz (3830 g) APGAR: 8, 9  Home with mother.  Kathleen [name] 09/12/2015, 8:46 AM

## 2015-09-16 ENCOUNTER — Inpatient Hospital Stay (HOSPITAL_COMMUNITY): Admission: RE | Admit: 2015-09-16 | Payer: Medicaid Other | Source: Ambulatory Visit

## 2016-04-10 IMAGING — CT CT ANGIO CHEST
1 of 2 series · 19 of 32 positions shown · IV contrast (OMNIPAQUE)
Comparison: None.

CLINICAL DATA: Short of breath, tachycardia, 35 weeks pregnant.

EXAM:
CT ANGIOGRAPHY CHEST WITH CONTRAST
TECHNIQUE: Multidetector CT imaging of the chest was performed using the
standard protocol during bolus administration of intravenous
contrast. Multiplanar CT image reconstructions and MIPs were
obtained to evaluate the vascular anatomy.
CONTRAST:  100mL OMNIPAQUE IOHEXOL 350 MG/ML SOLN

[Series 5: (person_name) thins · axial · 0.63mm/px · z∈[+924,+1192]mm · 19 of 426 slices shown]
[im 22/426  lung]
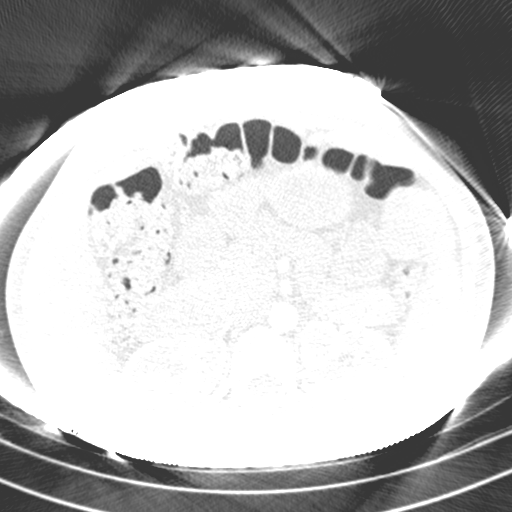
[im 43/426  mediastinal]
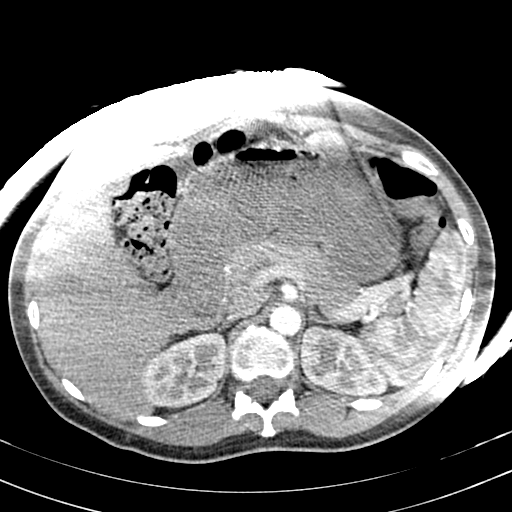
[im 64/426  lung]
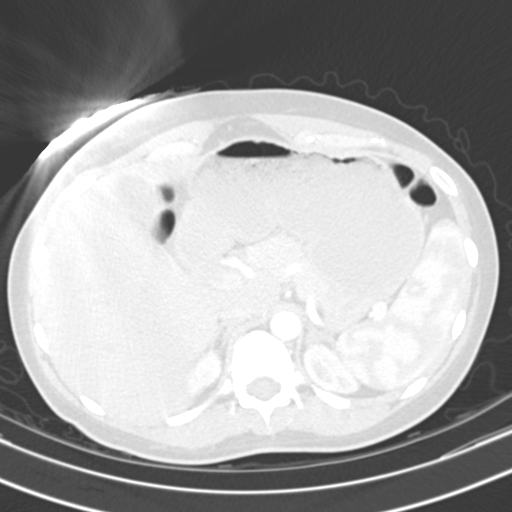
[im 107/426  mediastinal]
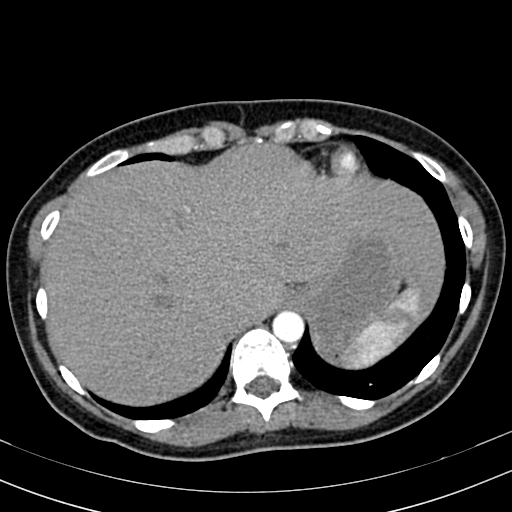
[im 128/426  lung]
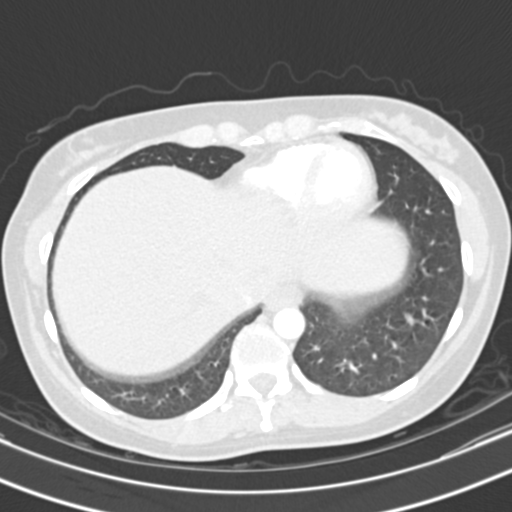
[im 142/426  mediastinal]
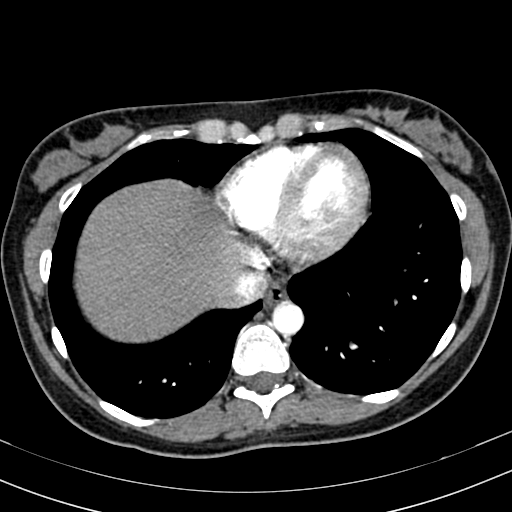
[im 149/426  lung]
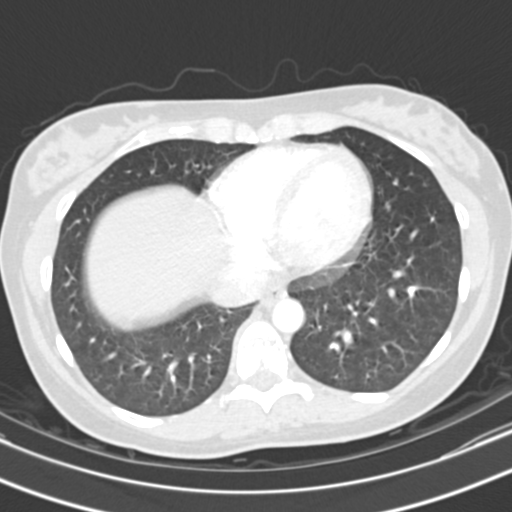
[im 171/426  mediastinal]
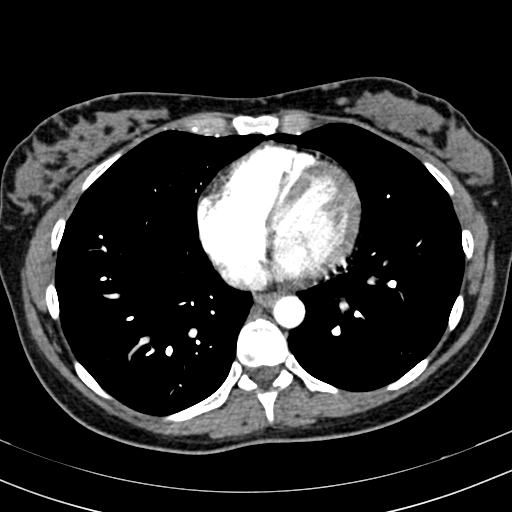
[im 192/426  lung]
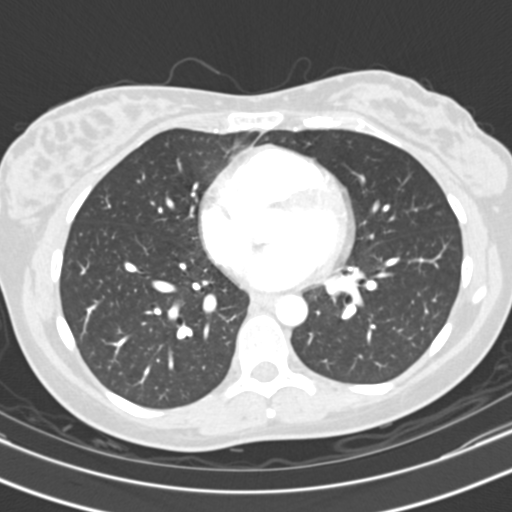
[im 213/426  mediastinal]
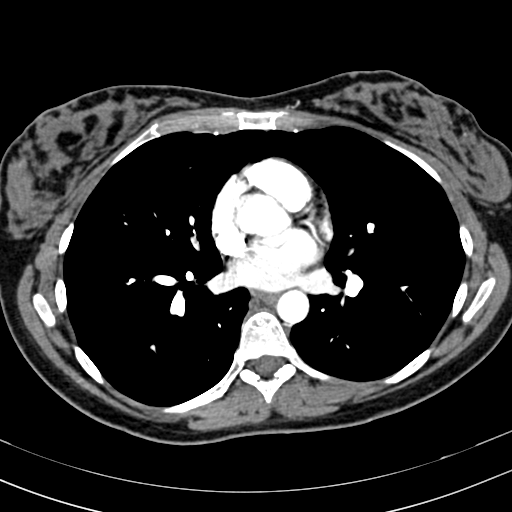
[im 234/426  lung]
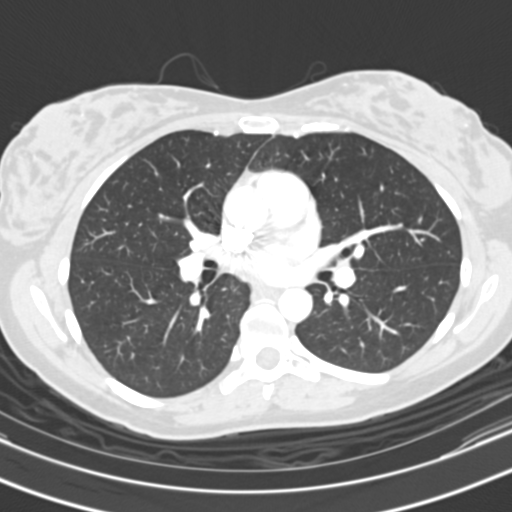
[im 256/426  mediastinal]
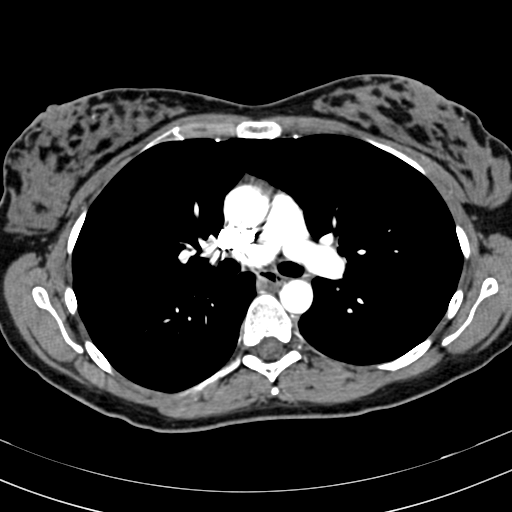
[im 277/426  lung]
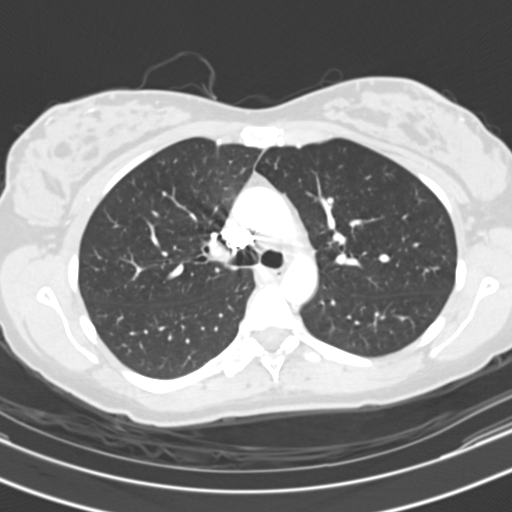
[im 284/426  mediastinal]
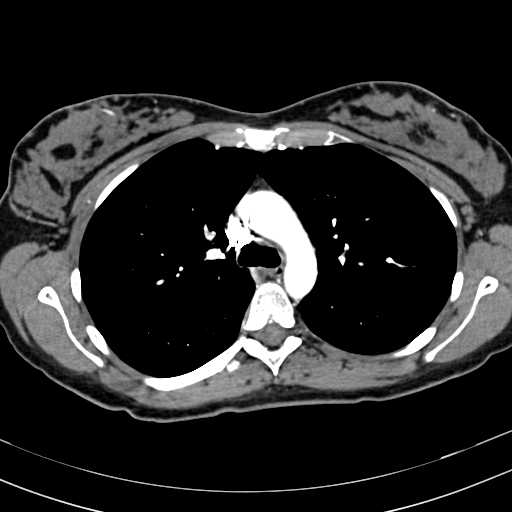
[im 298/426  lung]
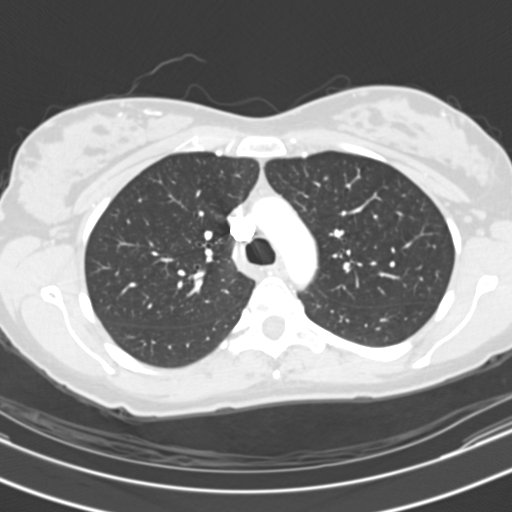
[im 319/426  mediastinal]
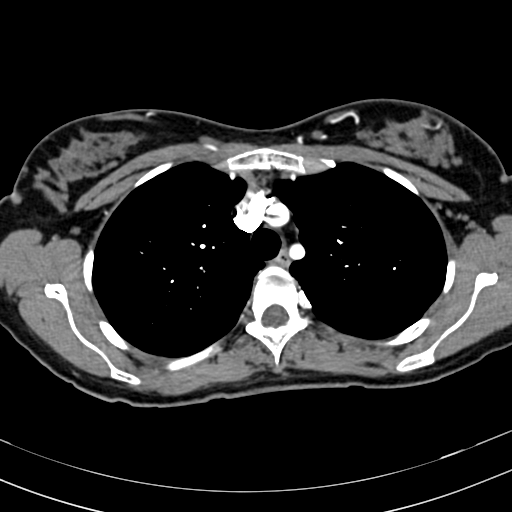
[im 362/426  lung]
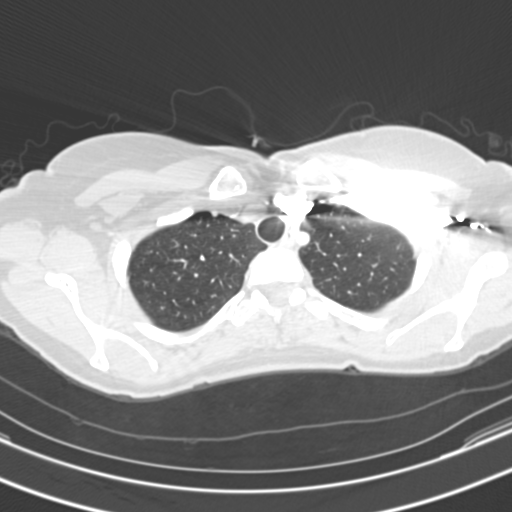
[im 383/426  mediastinal]
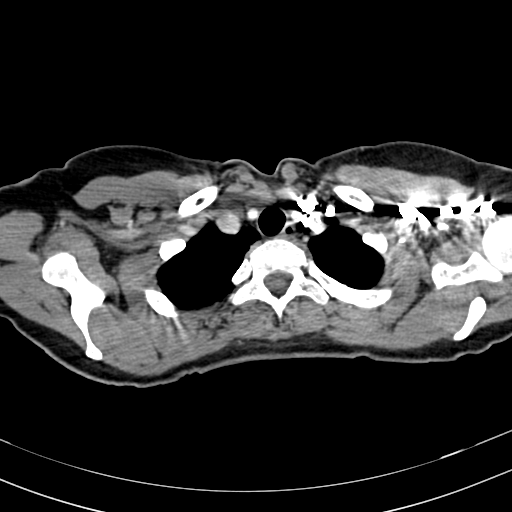
[im 404/426  lung]
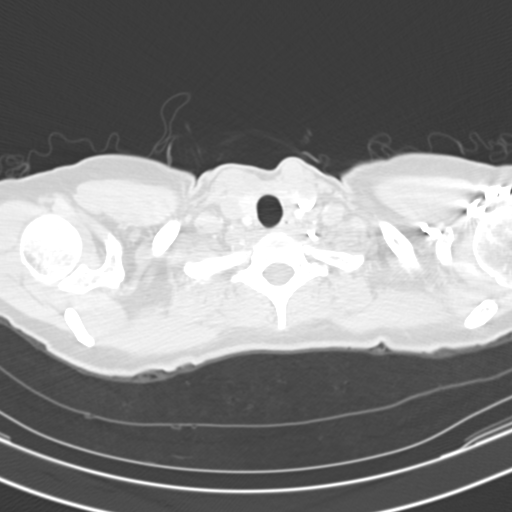

[19 of 32 positions shown; findings below may reference images not displayed]

FINDINGS: Mediastinum/Nodes: No filling defects within pulmonary suggest acute
pulmonary embolism. No acute findings aorta great vessels. No
pericardial fluid.

No axillary or supraclavicular lymphadenopathy. No mediastinal hilar
lymphadenopathy. No pericardial fluid. Esophagus normal.

Lungs/Pleura: No pulmonary infarction. No airspace disease. No
pleural fluid. No pulmonary edema. No pneumothorax.

Upper abdomen: Limited view of the liver, kidneys, pancreas are
unremarkable. Normal adrenal glands.

Musculoskeletal: No acute osseous abnormality.

Review of the MIP images confirms the above findings.
IMPRESSION: 1. No acute pulmonary embolism.
2. No acute pulmonary parenchymal findings.

## 2017-08-01 NOTE — L&D Delivery Note (Addendum)
Delivery Note  Pt reported sudden discomfort with contraction. When checked she was complete and +2. She pushed with two contractions. At 5:00 PM a viable female was delivered via Vaginal, Spontaneous (Presentation:LOA with compound right arm presentation ;  ).  APGAR: 8, 9; weight pending  .   Placenta status:delivered intact, schultz , .  Cord: 3vc  with the following complications: none.  Cord pH: n/a  Anesthesia:  Epidural Episiotomy: None Lacerations: None Suture Repair: n/a Est. Blood Loss (mL): 450  Mom to postpartum.  Baby to Couplet care / Skin to Skin  Offered pt BTL later tonight at 830pm or at noon tomorrow but pt would prefer to schedule interval   Cathrine Muster 05/07/2018, 5:51 PM

## 2017-10-16 LAB — OB RESULTS CONSOLE ABO/RH: RH TYPE: POSITIVE

## 2017-10-16 LAB — OB RESULTS CONSOLE RUBELLA ANTIBODY, IGM: Rubella: IMMUNE

## 2017-10-16 LAB — OB RESULTS CONSOLE GC/CHLAMYDIA
Chlamydia: NEGATIVE
Gonorrhea: NEGATIVE

## 2017-10-16 LAB — OB RESULTS CONSOLE HEPATITIS B SURFACE ANTIGEN: Hepatitis B Surface Ag: NEGATIVE

## 2017-10-16 LAB — OB RESULTS CONSOLE ANTIBODY SCREEN: Antibody Screen: NEGATIVE

## 2017-10-16 LAB — OB RESULTS CONSOLE HIV ANTIBODY (ROUTINE TESTING): HIV: NONREACTIVE

## 2017-10-16 LAB — OB RESULTS CONSOLE RPR: RPR: NONREACTIVE

## 2018-04-16 LAB — OB RESULTS CONSOLE GBS: STREP GROUP B AG: POSITIVE

## 2018-04-27 ENCOUNTER — Encounter (HOSPITAL_COMMUNITY): Payer: Self-pay | Admitting: *Deleted

## 2018-04-27 ENCOUNTER — Telehealth (HOSPITAL_COMMUNITY): Payer: Self-pay | Admitting: *Deleted

## 2018-04-27 NOTE — Telephone Encounter (Signed)
Preadmission screen  

## 2018-04-28 ENCOUNTER — Other Ambulatory Visit: Payer: Self-pay

## 2018-04-28 ENCOUNTER — Inpatient Hospital Stay (HOSPITAL_COMMUNITY)
Admission: AD | Admit: 2018-04-28 | Discharge: 2018-04-28 | Disposition: A | Discharge status: Home / Self Care | Payer: BLUE CROSS/BLUE SHIELD | Source: Ambulatory Visit | Attending: Obstetrics and Gynecology | Admitting: Obstetrics and Gynecology

## 2018-04-28 ENCOUNTER — Encounter (HOSPITAL_COMMUNITY): Payer: Self-pay

## 2018-04-28 DIAGNOSIS — O471 False labor at or after 37 completed weeks of gestation: Secondary | ICD-10-CM | POA: Diagnosis not present

## 2018-04-28 DIAGNOSIS — Z3A39 39 weeks gestation of pregnancy: Secondary | ICD-10-CM | POA: Insufficient documentation

## 2018-04-28 NOTE — MAU Note (Signed)
Pt. Reports ctx 5-6 min apart rated 4/10 that began at 1600 and became consistent at 1800. Denies leaking or bleeding. +FM

## 2018-04-28 NOTE — MAU Note (Signed)
I have communicated with Meisinger, MD and reviewed vital signs:  Vitals:   04/28/18 2111 04/28/18 2130  BP: (!) 142/82 (!) 141/78  Pulse: 88 84  Resp: 18   Temp: 98.7 F (37.1 C)     Vaginal exam:  Dilation: 1.5 Effacement (%): 50 Station: -3 Presentation: Vertex Exam by:: [employer], RN,   Also reviewed contraction pattern and that non-stress test is reactive.  It has been documented that patient is contracting irreggularly with minimal cervical change over 2 weeks not indicating active labor.  Patient denies any other complaints.  Based on this report provider has given order for discharge.  A discharge order and diagnosis entered by a provider.   Labor discharge instructions reviewed with patient.

## 2018-04-28 NOTE — Discharge Instructions (Signed)
Braxton Hicks Contractions °Contractions of the uterus can occur throughout pregnancy, but they are not always a sign that you are in labor. You may have practice contractions called Braxton Hicks contractions. These false labor contractions are sometimes confused with true labor. °What are Braxton Hicks contractions? °Braxton Hicks contractions are tightening movements that occur in the muscles of the uterus before labor. Unlike true labor contractions, these contractions do not result in opening (dilation) and thinning of the cervix. Toward the end of pregnancy (32-34 weeks), Braxton Hicks contractions can happen more often and may become stronger. These contractions are sometimes difficult to tell apart from true labor because they can be very uncomfortable. You should not feel embarrassed if you go to the hospital with false labor. °Sometimes, the only way to tell if you are in true labor is for your health care provider to look for changes in the cervix. The health care provider will do a physical exam and may monitor your contractions. If you are not in true labor, the exam should show that your cervix is not dilating and your water has not broken. °If there are other health problems associated with your pregnancy, it is completely safe for you to be sent home with false labor. You may continue to have Braxton Hicks contractions until you go into true labor. °How to tell the difference between true labor and false labor °True labor °· Contractions last 30-70 seconds. °· Contractions become very regular. °· Discomfort is usually felt in the top of the uterus, and it spreads to the lower abdomen and low back. °· Contractions do not go away with walking. °· Contractions usually become more intense and increase in frequency. °· The cervix dilates and gets thinner. °False labor °· Contractions are usually shorter and not as strong as true labor contractions. °· Contractions are usually irregular. °· Contractions  are often felt in the front of the lower abdomen and in the groin. °· Contractions may go away when you walk around or change positions while lying down. °· Contractions get weaker and are shorter-lasting as time goes on. °· The cervix usually does not dilate or become thin. °Follow these instructions at home: °· Take over-the-counter and prescription medicines only as told by your health care provider. °· Keep up with your usual exercises and follow other instructions from your health care provider. °· Eat and drink lightly if you think you are going into labor. °· If Braxton Hicks contractions are making you uncomfortable: °? Change your position from lying down or resting to walking, or change from walking to resting. °? Sit and rest in a tub of warm water. °? Drink enough fluid to keep your urine pale yellow. Dehydration may cause these contractions. °? Do slow and deep breathing several times an hour. °· Keep all follow-up prenatal visits as told by your health care provider. This is important. °Contact a health care provider if: °· You have a fever. °· You have continuous pain in your abdomen. °Get help right away if: °· Your contractions become stronger, more regular, and closer together. °· You have fluid leaking or gushing from your vagina. °· You pass blood-tinged mucus (bloody show). °· You have bleeding from your vagina. °· You have low back pain that you never had before. °· You feel your baby’s head pushing down and causing pelvic pressure. °· Your baby is not moving inside you as much as it used to. °Summary °· Contractions that occur before labor are called Braxton   Hicks contractions, false labor, or practice contractions. °· Braxton Hicks contractions are usually shorter, weaker, farther apart, and less regular than true labor contractions. True labor contractions usually become progressively stronger and regular and they become more frequent. °· Manage discomfort from Braxton Hicks contractions by  changing position, resting in a warm bath, drinking plenty of water, or practicing deep breathing. °This information is not intended to replace advice given to you by your health care provider. Make sure you discuss any questions you have with your health care provider. °Document Released: 12/01/2016 Document Revised: 12/01/2016 Document Reviewed: 12/01/2016 °Elsevier Interactive Patient Education © 2018 [employer] ° °

## 2018-05-02 ENCOUNTER — Telehealth (HOSPITAL_COMMUNITY): Payer: Self-pay | Admitting: *Deleted

## 2018-05-02 NOTE — Telephone Encounter (Signed)
Preadmission screen  

## 2018-05-07 ENCOUNTER — Encounter (HOSPITAL_COMMUNITY): Payer: Self-pay

## 2018-05-07 ENCOUNTER — Inpatient Hospital Stay (HOSPITAL_COMMUNITY): Payer: BLUE CROSS/BLUE SHIELD | Admitting: Anesthesiology

## 2018-05-07 ENCOUNTER — Inpatient Hospital Stay (HOSPITAL_COMMUNITY)
Admission: RE | Admit: 2018-05-07 | Discharge: 2018-05-08 | DRG: 807 | Disposition: A | Discharge status: Home / Self Care | Payer: BLUE CROSS/BLUE SHIELD | Attending: Obstetrics and Gynecology | Admitting: Obstetrics and Gynecology

## 2018-05-07 DIAGNOSIS — Z3A39 39 weeks gestation of pregnancy: Secondary | ICD-10-CM

## 2018-05-07 DIAGNOSIS — O322XX0 Maternal care for transverse and oblique lie, not applicable or unspecified: Secondary | ICD-10-CM | POA: Diagnosis present

## 2018-05-07 DIAGNOSIS — Z3490 Encounter for supervision of normal pregnancy, unspecified, unspecified trimester: Secondary | ICD-10-CM | POA: Diagnosis present

## 2018-05-07 DIAGNOSIS — F329 Major depressive disorder, single episode, unspecified: Secondary | ICD-10-CM | POA: Diagnosis present

## 2018-05-07 DIAGNOSIS — G2581 Restless legs syndrome: Secondary | ICD-10-CM | POA: Diagnosis present

## 2018-05-07 DIAGNOSIS — O99344 Other mental disorders complicating childbirth: Secondary | ICD-10-CM | POA: Diagnosis present

## 2018-05-07 DIAGNOSIS — Z87891 Personal history of nicotine dependence: Secondary | ICD-10-CM

## 2018-05-07 DIAGNOSIS — Z3483 Encounter for supervision of other normal pregnancy, third trimester: Secondary | ICD-10-CM | POA: Diagnosis present

## 2018-05-07 DIAGNOSIS — O99824 Streptococcus B carrier state complicating childbirth: Secondary | ICD-10-CM | POA: Diagnosis present

## 2018-05-07 LAB — CBC
HEMATOCRIT: 34.3 % — AB (ref 36.0–46.0)
HEMOGLOBIN: 10.8 g/dL — AB (ref 12.0–15.0)
MCH: 26 pg (ref 26.0–34.0)
MCHC: 31.5 g/dL (ref 30.0–36.0)
MCV: 82.5 fL (ref 78.0–100.0)
Platelets: 222 10*3/uL (ref 150–400)
RBC: 4.16 MIL/uL (ref 3.87–5.11)
RDW: 13.9 % (ref 11.5–15.5)
WBC: 10 10*3/uL (ref 4.0–10.5)

## 2018-05-07 LAB — RPR: RPR Ser Ql: NONREACTIVE

## 2018-05-07 LAB — TYPE AND SCREEN
ABO/RH(D): O POS
ANTIBODY SCREEN: NEGATIVE

## 2018-05-07 MED ORDER — LIDOCAINE HCL (PF) 1 % IJ SOLN
30.0000 mL | INTRAMUSCULAR | Status: DC | PRN
  Filled 2018-05-07: qty 30

## 2018-05-07 MED ORDER — COCONUT OIL OIL: 1.0000 "application " | TOPICAL_OIL | Status: DC | PRN

## 2018-05-07 MED ORDER — DIPHENHYDRAMINE HCL 25 MG PO CAPS: 25.0000 mg | ORAL_CAPSULE | Freq: Four times a day (QID) | ORAL | Status: DC | PRN

## 2018-05-07 MED ORDER — PHENYLEPHRINE 40 MCG/ML (10ML) SYRINGE FOR IV PUSH (FOR BLOOD PRESSURE SUPPORT)
80.0000 ug | PREFILLED_SYRINGE | INTRAVENOUS | Status: DC | PRN
  Filled 2018-05-07: qty 5

## 2018-05-07 MED ORDER — EPHEDRINE 5 MG/ML INJ
10.0000 mg | INTRAVENOUS | Status: DC | PRN
  Filled 2018-05-07: qty 2

## 2018-05-07 MED ORDER — ONDANSETRON HCL 4 MG PO TABS: 4.0000 mg | ORAL_TABLET | ORAL | Status: DC | PRN

## 2018-05-07 MED ORDER — PRENATAL MULTIVITAMIN CH
1.0000 | ORAL_TABLET | Freq: Every day | ORAL | Status: DC
  Administered 2018-05-08: 1 via ORAL
  Filled 2018-05-07: qty 1

## 2018-05-07 MED ORDER — IBUPROFEN 600 MG PO TABS
600.0000 mg | ORAL_TABLET | Freq: Four times a day (QID) | ORAL | Status: DC
  Administered 2018-05-08 (×4): 600 mg via ORAL
  Filled 2018-05-07 (×4): qty 1

## 2018-05-07 MED ORDER — OXYCODONE-ACETAMINOPHEN 5-325 MG PO TABS: 2.0000 | ORAL_TABLET | ORAL | Status: DC | PRN

## 2018-05-07 MED ORDER — DIBUCAINE 1 % RE OINT: 1.0000 "application " | TOPICAL_OINTMENT | RECTAL | Status: DC | PRN

## 2018-05-07 MED ORDER — SIMETHICONE 80 MG PO CHEW: 80.0000 mg | CHEWABLE_TABLET | ORAL | Status: DC | PRN

## 2018-05-07 MED ORDER — OXYCODONE-ACETAMINOPHEN 5-325 MG PO TABS: 1.0000 | ORAL_TABLET | ORAL | Status: DC | PRN

## 2018-05-07 MED ORDER — ONDANSETRON HCL 4 MG/2ML IJ SOLN: 4.0000 mg | INTRAMUSCULAR | Status: DC | PRN

## 2018-05-07 MED ORDER — OXYCODONE HCL 5 MG PO TABS: 10.0000 mg | ORAL_TABLET | ORAL | Status: DC | PRN

## 2018-05-07 MED ORDER — LACTATED RINGERS IV SOLN: 500.0000 mL | INTRAVENOUS | Status: DC | PRN

## 2018-05-07 MED ORDER — OXYTOCIN 40 UNITS IN LACTATED RINGERS INFUSION - SIMPLE MED: 2.5000 [IU]/h | INTRAVENOUS | Status: DC

## 2018-05-07 MED ORDER — LACTATED RINGERS IV SOLN
500.0000 mL | Freq: Once | INTRAVENOUS | Status: AC
  Administered 2018-05-07: 500 mL via INTRAVENOUS

## 2018-05-07 MED ORDER — ZOLPIDEM TARTRATE 5 MG PO TABS: 5.0000 mg | ORAL_TABLET | Freq: Every evening | ORAL | Status: DC | PRN

## 2018-05-07 MED ORDER — FENTANYL 2.5 MCG/ML BUPIVACAINE 1/10 % EPIDURAL INFUSION (WH - ANES)
14.0000 mL/h | INTRAMUSCULAR | Status: DC | PRN
  Administered 2018-05-07: 14 mL/h via EPIDURAL
  Filled 2018-05-07: qty 100

## 2018-05-07 MED ORDER — OXYTOCIN BOLUS FROM INFUSION
500.0000 mL | Freq: Once | INTRAVENOUS | Status: AC
  Administered 2018-05-07: 500 mL via INTRAVENOUS

## 2018-05-07 MED ORDER — ONDANSETRON HCL 4 MG/2ML IJ SOLN: 4.0000 mg | Freq: Four times a day (QID) | INTRAMUSCULAR | Status: DC | PRN

## 2018-05-07 MED ORDER — ACETAMINOPHEN 325 MG PO TABS
650.0000 mg | ORAL_TABLET | ORAL | Status: DC | PRN
  Administered 2018-05-07 – 2018-05-08 (×2): 650 mg via ORAL
  Filled 2018-05-07 (×2): qty 2

## 2018-05-07 MED ORDER — LIDOCAINE HCL (PF) 1 % IJ SOLN
INTRAMUSCULAR | Status: DC | PRN
  Administered 2018-05-07 (×2): 5 mL via EPIDURAL

## 2018-05-07 MED ORDER — PENICILLIN G 3 MILLION UNITS IVPB - SIMPLE MED
3.0000 10*6.[IU] | INTRAVENOUS | Status: DC
  Administered 2018-05-07 (×2): 3 10*6.[IU] via INTRAVENOUS
  Filled 2018-05-07 (×4): qty 100

## 2018-05-07 MED ORDER — ACETAMINOPHEN 325 MG PO TABS
650.0000 mg | ORAL_TABLET | ORAL | Status: DC | PRN
  Administered 2018-05-07: 650 mg via ORAL
  Filled 2018-05-07: qty 2

## 2018-05-07 MED ORDER — DIPHENHYDRAMINE HCL 50 MG/ML IJ SOLN: 12.5000 mg | INTRAMUSCULAR | Status: DC | PRN

## 2018-05-07 MED ORDER — WITCH HAZEL-GLYCERIN EX PADS: 1.0000 "application " | MEDICATED_PAD | CUTANEOUS | Status: DC | PRN

## 2018-05-07 MED ORDER — BENZOCAINE-MENTHOL 20-0.5 % EX AERO
1.0000 "application " | INHALATION_SPRAY | CUTANEOUS | Status: DC | PRN
  Administered 2018-05-08: 1 via TOPICAL
  Filled 2018-05-07: qty 56

## 2018-05-07 MED ORDER — OXYTOCIN 40 UNITS IN LACTATED RINGERS INFUSION - SIMPLE MED
1.0000 m[IU]/min | INTRAVENOUS | Status: DC
  Administered 2018-05-07: 2 m[IU]/min via INTRAVENOUS
  Filled 2018-05-07: qty 1000

## 2018-05-07 MED ORDER — PHENYLEPHRINE 40 MCG/ML (10ML) SYRINGE FOR IV PUSH (FOR BLOOD PRESSURE SUPPORT)
80.0000 ug | PREFILLED_SYRINGE | INTRAVENOUS | Status: DC | PRN
  Filled 2018-05-07: qty 5
  Filled 2018-05-07: qty 10

## 2018-05-07 MED ORDER — TETANUS-DIPHTH-ACELL PERTUSSIS 5-2.5-18.5 LF-MCG/0.5 IM SUSP: 0.5000 mL | Freq: Once | INTRAMUSCULAR | Status: DC

## 2018-05-07 MED ORDER — SENNOSIDES-DOCUSATE SODIUM 8.6-50 MG PO TABS
2.0000 | ORAL_TABLET | ORAL | Status: DC
  Administered 2018-05-08: 2 via ORAL
  Filled 2018-05-07: qty 2

## 2018-05-07 MED ORDER — TERBUTALINE SULFATE 1 MG/ML IJ SOLN
0.2500 mg | Freq: Once | INTRAMUSCULAR | Status: DC | PRN
  Filled 2018-05-07: qty 1

## 2018-05-07 MED ORDER — SOD CITRATE-CITRIC ACID 500-334 MG/5ML PO SOLN: 30.0000 mL | ORAL | Status: DC | PRN

## 2018-05-07 MED ORDER — OXYCODONE HCL 5 MG PO TABS: 5.0000 mg | ORAL_TABLET | ORAL | Status: DC | PRN

## 2018-05-07 MED ORDER — LACTATED RINGERS IV SOLN
INTRAVENOUS | Status: DC
  Administered 2018-05-07 (×2): via INTRAVENOUS

## 2018-05-07 MED ORDER — SODIUM CHLORIDE 0.9 % IV SOLN
5.0000 10*6.[IU] | Freq: Once | INTRAVENOUS | Status: AC
  Administered 2018-05-07: 5 10*6.[IU] via INTRAVENOUS
  Filled 2018-05-07: qty 5

## 2018-05-07 MED ORDER — BUPROPION HCL ER (XL) 150 MG PO TB24
450.0000 mg | ORAL_TABLET | Freq: Every day | ORAL | Status: DC
  Administered 2018-05-08: 450 mg via ORAL
  Filled 2018-05-07 (×2): qty 3

## 2018-05-07 NOTE — H&P (Addendum)
Kathleen [name] is a 37 y.o. [phone] female presenting at 77 1/[redacted]wks gestation for elective iol. Pt is dated per 7 week [place]. Her pregnancy has been complicated by AMA and low placenta which was noted to have resolved. She has had restless leg syndrome which made sleeping uncomfortable. She is GBS pos - not allergic to penicillin. Considering BTL.  OB History    Gravida  4   Para  2   Term  2   Preterm      AB  1   Living  2     SAB  1   TAB      Ectopic      Multiple  0   Live Births  1          Past Medical History:  Diagnosis Date  . AMA (advanced maternal age) multigravida 35+   . Anginal pain (HCC)   . Anxiety   . Depression   . Headache   . Hx of pyelonephritis   . Hx of varicella   . IBS (irritable bowel syndrome)   . PCOS (polycystic ovarian syndrome)    Past Surgical History:  Procedure Laterality Date  . NO PAST SURGERIES     Family History: family history includes Asthma in her maternal aunt; Heart disease in her paternal grandmother. Social History:  reports that she quit smoking about 8 years ago. She has a 10.00 pack-year smoking history. She has never used smokeless tobacco. She reports that she does not drink alcohol or use drugs.     Maternal Diabetes: No Genetic Screening: Normal: panorama low risk Maternal Ultrasounds/Referrals: Normal Fetal Ultrasounds or other Referrals:  None Maternal Substance Abuse:  No Significant Maternal Medications:  None Significant Maternal Lab Results:  Lab values include: Group B Strep positive Other Comments:  None  Review of Systems  Constitutional: Positive for malaise/fatigue. Negative for chills, fever and weight loss.  Eyes: Negative for blurred vision and double vision.  Respiratory: Negative for shortness of breath.   Cardiovascular: Negative for chest pain.  Gastrointestinal: Negative for abdominal pain, heartburn, nausea and vomiting.  Genitourinary: Negative for dysuria.  Musculoskeletal:  Positive for myalgias.  Neurological: Negative for dizziness and headaches.  Psychiatric/Behavioral: Negative for depression, hallucinations, substance abuse and suicidal ideas. The patient is not nervous/anxious.    Maternal Medical History:  Reason for admission: Nausea. Elective iol at term  Contractions: Onset was more than 2 days ago.   Frequency: irregular.   Perceived severity is mild.    Fetal activity: Perceived fetal activity is normal.   Last perceived fetal movement was within the past hour.    Prenatal complications: Restless leg syndrome  Prenatal Complications - Diabetes: none.      Blood pressure 138/90, pulse 95, temperature 97.8 F (36.6 C), temperature source Oral, resp. rate 18, height 5\' 9"  (1.753 m), weight 78.5 kg, unknown if currently breastfeeding. Maternal Exam:  Uterine Assessment: Contraction strength is mild.  Contraction frequency is irregular.   Abdomen: Patient reports generalized tenderness.  Estimated fetal weight is AGA.   Fetal presentation: vertex  Introitus: Normal vulva. Vulva is negative for condylomata and lesion.  Normal vagina.  Vagina is negative for condylomata.  Pelvis: adequate for delivery.   Cervix: Cervix evaluated by digital exam.     Fetal Exam Fetal Monitor Review: Baseline rate: 130.  Variability: moderate (6-25 bpm).   Pattern: accelerations present and no decelerations.    Fetal State Assessment: Category I - tracings are normal.  Physical Exam  Constitutional: She is oriented to person, place, and time. She appears well-developed and well-nourished.  Neck: Normal range of motion.  Cardiovascular: Normal rate.  Respiratory: Effort normal.  GI: Soft. There is generalized tenderness.  Genitourinary: Vagina normal and uterus normal. Vulva exhibits no lesion.  Musculoskeletal: Normal range of motion.  Neurological: She is alert and oriented to person, place, and time.  Skin: Skin is warm.  Psychiatric: She  has a normal mood and affect. Her behavior is normal. Judgment and thought content normal.    Prenatal labs: ABO, Rh: O/Positive/-- (03/18 0000) Antibody: n (03/18 0000) Rubella: Immune (03/18 0000) RPR: Nonreactive (03/18 0000)  HBsAg: Negative (03/18 0000)  HIV: Non-reactive (03/18 0000)  GBS: Positive (09/16 0000)   Assessment/Plan: 16XW [id number] at 39 1/7wks for elective iol: Cervix 1/60/-2; posterior Pitocin per protocol AROM when able Pain control prn PCN for GBS prophylaxis Anticipate svd  BTL pp if pt still desires and able per staffing  Janean Sark Tran Randle 05/07/2018, 8:20 AM

## 2018-05-07 NOTE — Anesthesia Pain Management Evaluation Note (Signed)
  CRNA Pain Management Visit Note  Patient: Kathleen [name], 37 y.o., female  "Hello I am a member of the anesthesia team at [facility]. We have an anesthesia team available at all times to provide care throughout the hospital, including epidural management and anesthesia for C-section. I don't know your plan for the delivery whether it a natural birth, water birth, IV sedation, nitrous supplementation, doula or epidural, but we want to meet your pain goals."   1.Was your pain managed to your expectations on prior hospitalizations?   Yes   2.What is your expectation for pain management during this hospitalization?     Epidural  3.How can we help you reach that goal? unsure  Record the patient's initial score and the patient's pain goal.   Pain: 2  Pain Goal: 6 [facility] wants you to be able to say your pain was always managed very well.  Cephus Shelling 05/07/2018

## 2018-05-07 NOTE — Anesthesia Preprocedure Evaluation (Signed)

## 2018-05-07 NOTE — Anesthesia Procedure Notes (Signed)
Epidural Patient location during procedure: OB  Staffing Anesthesiologist: Tavaras Goody, MD Performed: anesthesiologist   Preanesthetic Checklist Completed: patient identified, site marked, surgical consent, pre-op evaluation, timeout performed, IV checked, risks and benefits discussed and monitors and equipment checked  Epidural Patient position: sitting Prep: DuraPrep Patient monitoring: heart rate, continuous pulse ox and blood pressure Approach: midline Location: L3-L4 Injection technique: LOR saline  Needle:  Needle type: Tuohy  Needle gauge: 17 G Needle length: 9 cm and 9 Needle insertion depth: 5 cm Catheter type: closed end flexible Catheter size: 20 Guage Catheter at skin depth: 10 cm Test dose: negative  Assessment Events: blood not aspirated, injection not painful, no injection resistance, negative IV test and no paresthesia  Additional Notes Patient identified. Risks/Benefits/Options discussed with patient including but not limited to bleeding, infection, nerve damage, paralysis, failed block, incomplete pain control, headache, blood pressure changes, nausea, vomiting, reactions to medication both or allergic, itching and postpartum back pain. Confirmed with bedside nurse the patient's most recent platelet count. Confirmed with patient that they are not currently taking any anticoagulation, have any bleeding history or any family history of bleeding disorders. Patient expressed understanding and wished to proceed. All questions were answered. Sterile technique was used throughout the entire procedure. Please see nursing notes for vital signs. Test dose was given through epidural needle and negative prior to continuing to dose epidural or start infusion. Warning signs of high block given to the patient including shortness of breath, tingling/numbness in hands, complete motor block, or any concerning symptoms with instructions to call for help. Patient was given  instructions on fall risk and not to get out of bed. All questions and concerns addressed with instructions to call with any issues.     

## 2018-05-07 NOTE — Progress Notes (Signed)
Patient ID: Kathleen [name], female   DOB: 21-May-1981, 37 y.o.   MRN: [id number] Pt is appreciating some contractions. +Fms VSS EFM - cat 1  TOCO - contractions q 1-107mins SVE - 3/60/-2  AROM performed with small amount of clear fluid noted  A/P: Continue current expectant mgmt on pitocin         Anticipate svd

## 2018-05-08 ENCOUNTER — Encounter (HOSPITAL_COMMUNITY)
Admission: RE | Disposition: A | Discharge status: Home / Self Care | Payer: Self-pay | Source: Home / Self Care | Attending: Obstetrics and Gynecology

## 2018-05-08 LAB — CBC
HEMATOCRIT: 28.9 % — AB (ref 36.0–46.0)
Hemoglobin: 9.4 g/dL — ABNORMAL LOW (ref 12.0–15.0)
MCH: 26.5 pg (ref 26.0–34.0)
MCHC: 32.5 g/dL (ref 30.0–36.0)
MCV: 81.4 fL (ref 80.0–100.0)
Platelets: 185 10*3/uL (ref 150–400)
RBC: 3.55 MIL/uL — AB (ref 3.87–5.11)
RDW: 13.9 % (ref 11.5–15.5)
WBC: 10.9 10*3/uL — AB (ref 4.0–10.5)

## 2018-05-08 SURGERY — LIGATION, FALLOPIAN TUBE, POSTPARTUM
Anesthesia: Choice

## 2018-05-08 MED ORDER — ACETAMINOPHEN 325 MG PO TABS: 650.0000 mg | ORAL_TABLET | ORAL | 0 refills | Status: DC | PRN

## 2018-05-08 MED ORDER — IBUPROFEN 600 MG PO TABS: 600.0000 mg | ORAL_TABLET | Freq: Four times a day (QID) | ORAL | 0 refills | Status: DC

## 2018-05-08 NOTE — Progress Notes (Signed)
MOB was referred for history of depression/anxiety. * Referral screened out by Clinical Social Worker because none of the following criteria appear to apply: ~ History of anxiety/depression during this pregnancy, or of post-partum depression following prior delivery. ~ Diagnosis of anxiety and/or depression within last 3 years OR * MOB's symptoms currently being treated with medication and/or therapy. Please contact the Clinical Social Worker if needs arise, by MOB request, or if MOB scores greater than 9/yes to question 10 on Edinburgh Postpartum Depression Screen.  Rinnah Peppel, LCSW Clinical Social Worker  [facility] [employer]  [phone]  

## 2018-05-08 NOTE — Progress Notes (Signed)
Post Partum Day 1 Subjective: no complaints, up ad lib and tolerating PO Requesting early d/c home  Objective: Blood pressure 119/80, pulse 73, temperature 97.9 F (36.6 C), temperature source Oral, resp. rate 18, height 5\' 9"  (1.753 m), weight 78.5 kg, SpO2 100 %, unknown if currently breastfeeding.  Physical Exam:  General: alert and cooperative Lochia: appropriate Uterine Fundus: firm   Recent Labs    05/07/18 0820 05/08/18 0514  HGB 10.8* 9.4*  HCT 34.3* 28.9*    Assessment/Plan: Discharge home this PM if baby able to go D/w pt pp depression, on wellbutrin, but will call if not controlling sx D/w pt birth control options.  Considering interval BTL vs Mirena   LOS: 1 day   Oliver Pila 05/08/2018, 10:07 AM

## 2018-05-08 NOTE — Anesthesia Postprocedure Evaluation (Signed)
Anesthesia Post Note  Patient: Laurel Dimmer  Procedure(s) Performed: AN AD HOC LABOR EPIDURAL     Patient location during evaluation: Mother Baby Anesthesia Type: Epidural Level of consciousness: awake and alert and oriented Pain management: satisfactory to patient Vital Signs Assessment: post-procedure vital signs reviewed and stable Respiratory status: respiratory function stable Cardiovascular status: stable Postop Assessment: no headache, no backache, epidural receding, patient able to bend at knees, no signs of nausea or vomiting and adequate PO intake Anesthetic complications: no    Last Vitals:  Vitals:   05/08/18 0017 05/08/18 0426  BP: 117/81 131/81  Pulse: 76 77  Resp: 18 18  Temp: 36.9 C 36.5 C  SpO2:      Last Pain:  Vitals:   05/08/18 0617  TempSrc:   PainSc: 3    Pain Goal:                 Quanika Solem

## 2018-05-08 NOTE — Discharge Summary (Signed)
OB Discharge Summary     Patient Name: Kathleen [name] DOB: 10/28/1980 MRN: [id number]  Date of admission: 05/07/2018 Delivering MD: [facility]   Date of discharge: 05/08/2018  Admitting diagnosis: INDUCTION Intrauterine pregnancy: [redacted]w[redacted]d     Secondary diagnosis:  Active Problems:   Encounter for induction of labor   Spontaneous vaginal delivery   Postpartum care following vaginal delivery  Additional problems: GBS positive     Discharge diagnosis: Term Pregnancy Delivered                                                                                                Post partum procedures:none  Augmentation: AROM and Pitocin  Complications: None  Hospital course:  Induction of Labor With Vaginal Delivery   37 y.o. yo [phone] at [redacted]w[redacted]d was admitted to the hospital 05/07/2018 for induction of labor.  Indication for induction: Favorable cervix at term.  Patient had an uncomplicated labor course as follows: Membrane Rupture Time/Date: 11:47 AM ,05/07/2018   Intrapartum Procedures: Episiotomy: None [1]                                         Lacerations:  None [1]  Patient had delivery of a Viable infant.  Information for the patient's newborn:  Borghild, Thaker [[id number]]  Delivery Method: Vag-Spont   05/07/2018  Details of delivery can be found in separate delivery note.  Patient had a routine postpartum course. Patient is discharged home 05/08/18.  Physical exam  Vitals:   05/07/18 1945 05/08/18 0017 05/08/18 0426 05/08/18 0825  BP: 121/73 117/81 131/81 119/80  Pulse: 73 76 77 73  Resp: 18 18 18 18   Temp: 98.3 F (36.8 C) 98.4 F (36.9 C) 97.7 F (36.5 C) 97.9 F (36.6 C)  TempSrc: Oral Oral Oral Oral  SpO2:    100%  Weight:      Height:       General: alert and cooperative Lochia: appropriate Uterine Fundus: firm  Labs: Lab Results  Component Value Date   WBC 10.9 (H) 05/08/2018   HGB 9.4 (L) 05/08/2018   HCT 28.9 (L) 05/08/2018   MCV 81.4 05/08/2018   PLT 185 05/08/2018   CMP Latest Ref Rng & Units 08/14/2015  Glucose 65 - 99 mg/dL 130(Q)  BUN 6 - 20 mg/dL 9  Creatinine 6.57 - 8.46 mg/dL 9.62  Sodium 952 - 841 mmol/L 137  Potassium 3.5 - 5.1 mmol/L 3.6  Chloride 101 - 111 mmol/L 108  CO2 22 - 32 mmol/L 19(L)  Calcium 8.9 - 10.3 mg/dL 9.0  Total Protein 6.5 - 8.1 g/dL 7.1  Total Bilirubin 0.3 - 1.2 mg/dL 0.3  Alkaline Phos 38 - 126 U/L 137(H)  AST 15 - 41 U/L 22  ALT 14 - 54 U/L 12(L)    Discharge instruction: per After Visit Summary and "Baby and Me Booklet".  After visit meds:  Allergies as of 05/08/2018      Reactions   Phenobarbital Hives  Medication List    STOP taking these medications   pantoprazole 40 MG tablet Commonly known as:  PROTONIX     TAKE these medications   acetaminophen 325 MG tablet Commonly known as:  TYLENOL Take 2 tablets (650 mg total) by mouth every 4 (four) hours as needed (for pain scale < 4).   buPROPion 150 MG 24 hr tablet Commonly known as:  WELLBUTRIN XL Take 1 tablet (150 mg total) by mouth 2 (two) times daily. What changed:    when to take this  additional instructions   ibuprofen 600 MG tablet Commonly known as:  ADVIL,MOTRIN Take 1 tablet (600 mg total) by mouth every 6 (six) hours.   prenatal multivitamin Tabs tablet Take 1 tablet by mouth daily at 12 noon.       Diet: routine diet  Activity: Advance as tolerated. Pelvic rest for 6 weeks.   Outpatient follow up:6 weeks Follow up Appt:No future appointments. Follow up Visit:No follow-ups on file.  Postpartum contraception: Undecided  Newborn Data: Live born female  Birth Weight: 6 lb 5.2 oz (2870 g) APGAR: 8, 9  Newborn Delivery   Birth date/time:  05/07/2018 17:00:00 Delivery type:  Vaginal, Spontaneous     Baby Feeding: Breast Disposition:home with mother   05/08/2018 Oliver Pila, MD

## 2018-05-10 ENCOUNTER — Inpatient Hospital Stay (HOSPITAL_COMMUNITY): Admission: RE | Admit: 2018-05-10 | Payer: BLUE CROSS/BLUE SHIELD | Source: Ambulatory Visit

## 2018-06-18 ENCOUNTER — Encounter (HOSPITAL_COMMUNITY): Payer: Self-pay

## 2018-10-31 ENCOUNTER — Telehealth: Payer: BLUE CROSS/BLUE SHIELD | Admitting: Family

## 2018-10-31 DIAGNOSIS — Z7189 Other specified counseling: Secondary | ICD-10-CM

## 2018-10-31 DIAGNOSIS — R6889 Other general symptoms and signs: Secondary | ICD-10-CM

## 2018-10-31 MED ORDER — BENZONATATE 100 MG PO CAPS: 100.0000 mg | ORAL_CAPSULE | Freq: Three times a day (TID) | ORAL | 0 refills | Status: DC | PRN

## 2018-10-31 NOTE — Progress Notes (Signed)
E-Visit for Corona Virus Screening  Based on your current symptoms, you may very well have the virus, however your symptoms are mild. Currently, not all patients are being tested. If the symptoms are mild and there is not a known exposure, performing the test is not indicated.   Approximately 5 minutes was spent documenting and reviewing patient's chart.    Coronavirus disease 2019 (COVID-19) is a respiratory illness that can spread from person to person. The virus that causes COVID-19 is a new virus that was first identified in the country of [place] but is now found in multiple other countries and has spread to the [place].  Symptoms associated with the virus are mild to severe fever, cough, and shortness of breath. There is currently no vaccine to protect against COVID-19, and there is no specific antiviral treatment for the virus.   To be considered HIGH RISK for Coronavirus (COVID-19), you have to meet the following criteria:  . Traveled to [place], [place], [place], [place] or [place]; or in the [place] to [place], [place], [place], or [place]; and have fever, cough, and shortness of breath within the last 2 weeks of travel OR  . Been in close contact with a person diagnosed with COVID-19 within the last 2 weeks and have fever, cough, and shortness of breath  . IF YOU DO NOT MEET THESE CRITERIA, YOU ARE CONSIDERED LOW RISK FOR COVID-19.   It is vitally important that if you feel that you have an infection such as this virus or any other virus that you stay home and away from places where you may spread it to others.  You should self-quarantine for 14 days if you have symptoms that could potentially be coronavirus and avoid contact with people age 65 and older.   You can use medication such as A prescription cough medication called Tessalon Perles 100 mg. You may take 1-2 capsules every 8 hours as needed for cough  You may also take acetaminophen (Tylenol) as needed for  fever.   Reduce your risk of any infection by using the same precautions used for avoiding the common cold or flu:  . Wash your hands often with soap and warm water for at least 20 seconds.  If soap and water are not readily available, use an alcohol-based hand sanitizer with at least 60% alcohol.  . If coughing or sneezing, cover your mouth and nose by coughing or sneezing into the elbow areas of your shirt or coat, into a tissue or into your sleeve (not your hands). . Avoid shaking hands with others and consider head nods or verbal greetings only. . Avoid touching your eyes, nose, or mouth with unwashed hands.  . Avoid close contact with people who are sick. . Avoid places or events with large numbers of people in one location, like concerts or sporting events. . Carefully consider travel plans you have or are making. . If you are planning any travel outside or inside the [place], visit the CDC's Travelers' Health webpage for the latest health notices. . If you have some symptoms but not all symptoms, continue to monitor at home and seek medical attention if your symptoms worsen. . If you are having a medical emergency, call 911.  HOME CARE . Only take medications as instructed by your medical team. . Drink plenty of fluids and get plenty of rest. . A steam or ultrasonic humidifier can help if you have congestion.   GET HELP RIGHT AWAY IF: .   You develop worsening fever. . You become short of breath . You cough up blood. . Your symptoms become more severe MAKE SURE YOU   Understand these instructions.  Will watch your condition.  Will get help right away if you are not doing well or get worse.  Your e-visit answers were reviewed by a board certified advanced clinical practitioner to complete your personal care plan.  Depending on the condition, your plan could have included both over the counter or prescription medications.  If there is a problem please reply once you have received a  response from your provider. Your safety is important to us.  If you have drug allergies check your prescription carefully.    You can use MyChart to ask questions about today's visit, request a non-urgent call back, or ask for a work or school excuse for 24 hours related to this e-Visit. If it has been greater than 24 hours you will need to follow up with your provider, or enter a new e-Visit to address those concerns. You will get an e-mail in the next two days asking about your experience.  I hope that your e-visit has been valuable and will speed your recovery. Thank you for using e-visits.    

## 2018-12-12 DIAGNOSIS — H5713 Ocular pain, bilateral: Secondary | ICD-10-CM | POA: Diagnosis not present

## 2019-04-12 ENCOUNTER — Encounter: Payer: Self-pay | Admitting: Nurse Practitioner

## 2019-04-12 ENCOUNTER — Telehealth: Payer: BLUE CROSS/BLUE SHIELD | Admitting: Nurse Practitioner

## 2019-04-12 DIAGNOSIS — Z20828 Contact with and (suspected) exposure to other viral communicable diseases: Secondary | ICD-10-CM | POA: Diagnosis not present

## 2019-04-12 DIAGNOSIS — Z20822 Contact with and (suspected) exposure to covid-19: Secondary | ICD-10-CM

## 2019-04-12 DIAGNOSIS — R509 Fever, unspecified: Secondary | ICD-10-CM | POA: Diagnosis not present

## 2019-04-12 DIAGNOSIS — R059 Cough, unspecified: Secondary | ICD-10-CM

## 2019-04-12 DIAGNOSIS — R05 Cough: Secondary | ICD-10-CM

## 2019-04-12 MED ORDER — PROMETHAZINE-DM 6.25-15 MG/5ML PO SYRP: 5.0000 mL | ORAL_SOLUTION | Freq: Four times a day (QID) | ORAL | 0 refills | Status: AC | PRN

## 2019-04-12 NOTE — Progress Notes (Signed)
E-Visit for Corona Virus Screening   Your current symptoms could be consistent with the coronavirus.  Many health care providers can now test patients at their office but not all are.  [facility] has multiple testing sites. For information on our COVID testing locations and hours go to [email or web address]  Please quarantine yourself while awaiting your test results.  We are enrolling you in our MyChart Home Montioring for COVID19 . Daily you will receive a questionnaire within the MyChart website. Our COVID 19 response team willl be monitoriing your responses daily.    COVID-19 is a respiratory illness with symptoms that are similar to the flu. Symptoms are typically mild to moderate, but there have been cases of severe illness and death due to the virus. The following symptoms may appear 2-14 days after exposure: . Fever . Cough . Shortness of breath or difficulty breathing . Chills . Repeated shaking with chills . Muscle pain . Headache . Sore throat . New loss of taste or smell . Fatigue . Congestion or runny nose . Nausea or vomiting . Diarrhea  It is vitally important that if you feel that you have an infection such as this virus or any other virus that you stay home and away from places where you may spread it to others.  You should self-quarantine for 14 days if you have symptoms that could potentially be coronavirus or have been in close contact a with a person diagnosed with COVID-19 within the last 2 weeks. You should avoid contact with people age 65 and older.   You should wear a mask or cloth face covering over your nose and mouth if you must be around other people or animals, including pets (even at home). Try to stay at least 6 feet away from other people. This will protect the people around you.  You can use medication such as A prescription cough medication called Phenergan DM 6.25 mg/15 mg. You make take one teaspoon / 5 ml every 4-6 hours as  needed for cough  You may also take acetaminophen (Tylenol) as needed for fever.   Reduce your risk of any infection by using the same precautions used for avoiding the common cold or flu:  . Wash your hands often with soap and warm water for at least 20 seconds.  If soap and water are not readily available, use an alcohol-based hand sanitizer with at least 60% alcohol.  . If coughing or sneezing, cover your mouth and nose by coughing or sneezing into the elbow areas of your shirt or coat, into a tissue or into your sleeve (not your hands). . Avoid shaking hands with others and consider head nods or verbal greetings only. . Avoid touching your eyes, nose, or mouth with unwashed hands.  . Avoid close contact with people who are sick. . Avoid places or events with large numbers of people in one location, like concerts or sporting events. . Carefully consider travel plans you have or are making. . If you are planning any travel outside or inside the [place], visit the CDC's Travelers' Health webpage for the latest health notices. . If you have some symptoms but not all symptoms, continue to monitor at home and seek medical attention if your symptoms worsen. . If you are having a medical emergency, call 911.  HOME CARE . Only take medications as instructed by your medical team. . Drink plenty of fluids and get plenty of rest. . A steam or ultrasonic humidifier can help if you   have congestion.   GET HELP RIGHT AWAY IF YOU HAVE EMERGENCY WARNING SIGNS** FOR COVID-19. If you or someone is showing any of these signs seek emergency medical care immediately. Call 911 or proceed to your closest emergency facility if: . You develop worsening high fever. . Trouble breathing . Bluish lips or face . Persistent pain or pressure in the chest . New confusion . Inability to wake or stay awake . You cough up blood. . Your symptoms become more severe  **This list is not all possible symptoms. Contact your  medical provider for any symptoms that are sever or concerning to you.   MAKE SURE YOU   Understand these instructions.  Will watch your condition.  Will get help right away if you are not doing well or get worse.  Your e-visit answers were reviewed by a board certified advanced clinical practitioner to complete your personal care plan.  Depending on the condition, your plan could have included both over the counter or prescription medications.  If there is a problem please reply once you have received a response from your provider.  Your safety is important to [place].  If you have drug allergies check your prescription carefully.    You can use MyChart to ask questions about today's visit, request a non-urgent call back, or ask for a work or school excuse for 24 hours related to this e-Visit. If it has been greater than 24 hours you will need to follow up with your provider, or enter a new e-Visit to address those concerns. You will get an e-mail in the next two days asking about your experience.  I hope that your e-visit has been valuable and will speed your recovery. Thank you for using e-visits.  Approximately 5 minutes were spent reviewing and documenting in the patient's chart.

## 2019-04-29 DIAGNOSIS — R000 Tachycardia, unspecified: Secondary | ICD-10-CM | POA: Diagnosis not present

## 2019-04-29 DIAGNOSIS — Z23 Encounter for immunization: Secondary | ICD-10-CM | POA: Diagnosis not present

## 2019-04-29 DIAGNOSIS — F411 Generalized anxiety disorder: Secondary | ICD-10-CM | POA: Diagnosis not present

## 2019-05-10 DIAGNOSIS — Z713 Dietary counseling and surveillance: Secondary | ICD-10-CM | POA: Diagnosis not present

## 2019-05-30 DIAGNOSIS — Z713 Dietary counseling and surveillance: Secondary | ICD-10-CM | POA: Diagnosis not present

## 2019-07-24 ENCOUNTER — Telehealth: Payer: Self-pay | Admitting: Family

## 2019-07-24 DIAGNOSIS — W57XXXA Bitten or stung by nonvenomous insect and other nonvenomous arthropods, initial encounter: Secondary | ICD-10-CM

## 2019-07-24 DIAGNOSIS — S50869A Insect bite (nonvenomous) of unspecified forearm, initial encounter: Secondary | ICD-10-CM

## 2019-07-24 MED ORDER — SULFAMETHOXAZOLE-TRIMETHOPRIM 800-160 MG PO TABS: 1.0000 | ORAL_TABLET | Freq: Two times a day (BID) | ORAL | 0 refills | Status: DC

## 2019-07-24 NOTE — Progress Notes (Signed)
E Visit for Cellulitis ° °We are sorry that you are not feeling well. Here is how we plan to help! ° °Based on what you shared with me it looks like you have cellulitis.  Cellulitis looks like areas of skin redness, swelling, and warmth; it develops as a result of bacteria entering under the skin. Little red spots and/or bleeding can be seen in skin, and tiny surface sacs containing fluid can occur. Fever can be present. Cellulitis is almost always on one side of a body, and the lower limbs are the most common site of involvement.  ° °I have prescribed:  Bactrim DS 1 tablet by mouth twice a day for 7 days. ° °HOME CARE: ° °Take your medications as ordered and take all of them, even if the skin irritation appears to be healing.  ° °GET HELP RIGHT AWAY IF: ° °Symptoms that don't begin to go away within 48 hours. °Severe redness persists or worsens °If the area turns color, spreads or swells. °If it blisters and opens, develops yellow-brown crust or bleeds. °You develop a fever or chills. °If the pain increases or becomes unbearable.  °Are unable to keep fluids and food down. ° °MAKE SURE YOU  ° °Understand these instructions. °Will watch your condition. °Will get help right away if you are not doing well or get worse. ° °Thank you for choosing an e-visit. ° °Your e-visit answers were reviewed by a board certified advanced clinical practitioner to complete your personal care plan. Depending upon the condition, your plan could have included both over the counter or prescription medications. ° °Please review your pharmacy choice. Make sure the pharmacy is open so you can pick up prescription now. If there is a problem, you may contact your provider through MyChart messaging and have the prescription routed to another pharmacy.  Your safety is important to us. If you have drug allergies check your prescription carefully.  ° °For the next 24 hours you can use MyChart to ask questions about today's visit, request a  non-urgent call back, or ask for a work or school excuse. °You will get an email in the next two days asking about your experience. I hope that your e-visit has been valuable and will speed your recovery. ° °Approximately 5 minutes was spent documenting and reviewing patient's chart.  ° ° °

## 2019-08-16 ENCOUNTER — Telehealth: Payer: Self-pay | Admitting: Family

## 2019-08-16 DIAGNOSIS — Z20822 Contact with and (suspected) exposure to covid-19: Secondary | ICD-10-CM | POA: Diagnosis not present

## 2019-08-16 MED ORDER — BENZONATATE 100 MG PO CAPS: 100.0000 mg | ORAL_CAPSULE | Freq: Three times a day (TID) | ORAL | 0 refills | Status: DC | PRN

## 2019-08-16 NOTE — Progress Notes (Signed)
E-Visit for Corona Virus Screening  Your current symptoms could be consistent with the coronavirus.  Many health care providers can now test patients at their office but not all are.  [facility] has multiple testing sites. For information on our COVID testing locations and hours go to [email or web address]  We are enrolling you in our MyChart Home Monitoring for COVID19 . Daily you will receive a questionnaire within the MyChart website. Our COVID 19 response team will be monitoring your responses daily.  Testing Information: The COVID-19 Community Testing sites will begin testing BY APPOINTMENT ONLY.  You can schedule online at [email or web address]  If you do not have access to a smart phone or computer you may call [phone] for an appointment.  Testing Locations: Appointment schedule is 8 am to 3:30 pm at all sites  [facility] indoors at [address], [place] [address] [facility]  indoors at [address], [place], [place] [address] [place] indoors at [address], [place]  [address]  Additional testing sites in the Community:  . For [facility] Testing sites in [place]  https://www.cvs.com/minuteclinic/covid-19-testing  . For Pop-up testing sites in [place]  https://covid19.ncdhhs.gov/about-covid-19/testing/find-my-testing-place/pop-testing-sites  . For Testing sites with regular hours https://onsms.org/[email or web address]  . For [place] [facility] [email or web address]-community-[email or web address]-[email or web address]  . For [facility] https://www.[place].gov[place]/human-services/health-department/coronavirus-covid-19-info/covid-19-testing  . For [place] testing in [place] https://www.[place].gov/[place]/human-services/health-department/coronavirus-covid-19-info/covid-19-testing  . For [facility] testing in [place]   https://[facility].care/covidtesting  For  more information  about community testing call [phone]   We are enrolling you in our MyChart Home Monitoring for COVID19 . Daily you will receive a questionnaire within the MyChart website. Our COVID 19 response team will be monitoring your responses daily.  Please quarantine yourself while awaiting your test results. Please stay home for a minimum of 10 days from the first day of illness with improving symptoms and you have had 24 hours of no fever (without the use of Tylenol (Acetaminophen) Motrin (Ibuprofen) or any fever reducing medication).  Also - Do not get tested prior to returning to [facility] because once you have had a positive test the test can stay positive for more then a month in some cases.   You should wear a mask or cloth face covering over your nose and mouth if you must be around other people or animals, including pets (even at home). Try to stay at least 6 feet away from other people. This will protect the people around you.  Please continue good preventive care measures, including:  frequent hand-washing, avoid touching your face, cover coughs/sneezes, stay out of crowds and keep a 6 foot distance from others.  COVID-19 is a respiratory illness with symptoms that are similar to the flu. Symptoms are typically mild to moderate, but there have been cases of severe illness and death due to the virus.   The following symptoms may appear 2-14 days after exposure: . Fever . Cough . Shortness of breath or difficulty breathing . Chills . Repeated shaking with chills . Muscle pain . Headache . Sore throat . New loss of taste or smell . Fatigue . Congestion or runny nose . Nausea or vomiting . Diarrhea  Go to the nearest hospital ED for assessment if fever/cough/breathlessness are severe or illness seems like a threat to life.  It is vitally important that if you feel that you have an infection such as this virus or any other virus that you stay home and away from places   where you may spread it to  others.  You should avoid contact with people age 65 and older.   You can use medication such as A prescription cough medication called Tessalon Perles 100 mg. You may take 1-2 capsules every 8 hours as needed for cough.  You may also take acetaminophen (Tylenol) as needed for fever.  Reduce your risk of any infection by using the same precautions used for avoiding the common cold or flu:  . Wash your hands often with soap and warm water for at least 20 seconds.  If soap and water are not readily available, use an alcohol-based hand sanitizer with at least 60% alcohol.  . If coughing or sneezing, cover your mouth and nose by coughing or sneezing into the elbow areas of your shirt or coat, into a tissue or into your sleeve (not your hands). . Avoid shaking hands with others and consider head nods or verbal greetings only. . Avoid touching your eyes, nose, or mouth with unwashed hands.  . Avoid close contact with people who are sick. . Avoid places or events with large numbers of people in one location, like concerts or sporting events. . Carefully consider travel plans you have or are making. . If you are planning any travel outside or inside the [place], visit the CDC's Travelers' Health webpage for the latest health notices. . If you have some symptoms but not all symptoms, continue to monitor at home and seek medical attention if your symptoms worsen. . If you are having a medical emergency, call 911.  HOME CARE . Only take medications as instructed by your medical team. . Drink plenty of fluids and get plenty of rest. . A steam or ultrasonic humidifier can help if you have congestion.   GET HELP RIGHT AWAY IF YOU HAVE EMERGENCY WARNING SIGNS** FOR COVID-19. If you or someone is showing any of these signs seek emergency medical care immediately. Call 911 or proceed to your closest emergency facility if: . You develop worsening high fever. . Trouble breathing . Bluish lips or face . Persistent  pain or pressure in the chest . New confusion . Inability to wake or stay awake . You cough up blood. . Your symptoms become more severe  **This list is not all possible symptoms. Contact your medical provider for any symptoms that are sever or concerning to you.  MAKE SURE YOU   Understand these instructions.  Will watch your condition.  Will get help right away if you are not doing well or get worse.  Your e-visit answers were reviewed by a board certified advanced clinical practitioner to complete your personal care plan.  Depending on the condition, your plan could have included both over the counter or prescription medications.  If there is a problem please reply once you have received a response from your provider.  Your safety is important to us.  If you have drug allergies check your prescription carefully.    You can use MyChart to ask questions about today's visit, request a non-urgent call back, or ask for a work or school excuse for 24 hours related to this e-Visit. If it has been greater than 24 hours you will need to follow up with your provider, or enter a new e-Visit to address those concerns. You will get an e-mail in the next two days asking about your experience.  I hope that your e-visit has been valuable and will speed your recovery. Thank you for using e-visits.  Approximately   5 minutes was spent documenting and reviewing patient's chart.    

## 2019-10-25 ENCOUNTER — Ambulatory Visit: Payer: Self-pay

## 2019-10-26 ENCOUNTER — Ambulatory Visit: Payer: Self-pay

## 2019-11-02 ENCOUNTER — Ambulatory Visit: Payer: Self-pay | Attending: Internal Medicine

## 2019-11-02 DIAGNOSIS — Z23 Encounter for immunization: Secondary | ICD-10-CM

## 2019-11-02 NOTE — Progress Notes (Signed)
   Covid-19 Vaccination Clinic  Name:  CARNELL CASAMENTO    MRN: [id number] DOB: 05-12-1981  11/02/2019  Ms. Yoon was observed post Covid-19 immunization for 15 minutes without incident. She was provided with Vaccine Information Sheet and instruction to access the V-Safe system.   Ms. Deremer was instructed to call 911 with any severe reactions post vaccine: Marland Kitchen Difficulty breathing  . Swelling of face and throat  . A fast heartbeat  . A bad rash all over body  . Dizziness and weakness   Immunizations Administered    Name Date Dose VIS Date Route   Pfizer COVID-19 Vaccine 11/02/2019  1:39 PM 0.3 mL 07/12/2019 Intramuscular   Manufacturer: [employer], [employer]   Lot: [id number]   NDC: [address]-6

## 2019-11-27 ENCOUNTER — Ambulatory Visit: Payer: Self-pay

## 2019-12-05 DIAGNOSIS — Z01419 Encounter for gynecological examination (general) (routine) without abnormal findings: Secondary | ICD-10-CM | POA: Diagnosis not present

## 2019-12-05 DIAGNOSIS — Z6820 Body mass index (BMI) 20.0-20.9, adult: Secondary | ICD-10-CM | POA: Diagnosis not present

## 2019-12-05 DIAGNOSIS — Z130 Encounter for screening for diseases of the blood and blood-forming organs and certain disorders involving the immune mechanism: Secondary | ICD-10-CM | POA: Diagnosis not present

## 2019-12-05 DIAGNOSIS — Z1389 Encounter for screening for other disorder: Secondary | ICD-10-CM | POA: Diagnosis not present

## 2019-12-14 ENCOUNTER — Telehealth: Payer: Self-pay | Admitting: Family

## 2019-12-14 DIAGNOSIS — L03211 Cellulitis of face: Secondary | ICD-10-CM

## 2019-12-14 MED ORDER — SULFAMETHOXAZOLE-TRIMETHOPRIM 800-160 MG PO TABS: 1.0000 | ORAL_TABLET | Freq: Two times a day (BID) | ORAL | 0 refills | Status: DC

## 2019-12-14 NOTE — Progress Notes (Signed)
E Visit for Cellulitis  We are sorry that you are not feeling well. Here is how we plan to help!  Based on what you shared with me it looks like you have cellulitis.  Cellulitis looks like areas of skin redness, swelling, and warmth; it develops as a result of bacteria entering under the skin. Little red spots and/or bleeding can be seen in skin, and tiny surface sacs containing fluid can occur. Fever can be present. Cellulitis is almost always on one side of a body, and the lower limbs are the most common site of involvement.   I have prescribed:  Bactrim DS 1 tablet by mouth twice a day for 7 days  HOME CARE:  . Take your medications as ordered and take all of them, even if the skin irritation appears to be healing.   GET HELP RIGHT AWAY IF:  . Symptoms that don't begin to go away within 48 hours. . Severe redness persists or worsens . If the area turns color, spreads or swells. . If it blisters and opens, develops yellow-brown crust or bleeds. . You develop a fever or chills. . If the pain increases or becomes unbearable.  . Are unable to keep fluids and food down.  MAKE SURE YOU    Understand these instructions.  Will watch your condition.  Will get help right away if you are not doing well or get worse.  Thank you for choosing an e-visit. Your e-visit answers were reviewed by a board certified advanced clinical practitioner to complete your personal care plan. Depending upon the condition, your plan could have included both over the counter or prescription medications. Please review your pharmacy choice. Make sure the pharmacy is open so you can pick up prescription now. If there is a problem, you may contact your provider through MyChart messaging and have the prescription routed to another pharmacy. Your safety is important to us. If you have drug allergies check your prescription carefully.  For the next 24 hours you can use MyChart to ask questions about today's visit,  request a non-urgent call back, or ask for a work or school excuse. You will get an email in the next two days asking about your experience. I hope that your e-visit has been valuable and will speed your recovery.   Greater than 5 minutes, yet less than 10 minutes of time have been spent researching, coordinating, and implementing care for this patient today.  Thank you for the details you included in the comment boxes. Those details are very helpful in determining the best course of treatment for you and help us to provide the best care.  

## 2019-12-16 ENCOUNTER — Telehealth: Payer: Self-pay | Admitting: Nurse Practitioner

## 2019-12-16 DIAGNOSIS — L03211 Cellulitis of face: Secondary | ICD-10-CM

## 2019-12-16 DIAGNOSIS — L0201 Cutaneous abscess of face: Secondary | ICD-10-CM | POA: Diagnosis not present

## 2019-12-16 NOTE — Progress Notes (Signed)
Based on what you shared with me it looks like you have cellulitis which is lowly responding to antibiotics, .should be evaluated in a face to face office visit You need a face to face visit because you may need an IM injection to clear this up,that .     NOTE: If you entered your credit card information for this eVisit, you will not be charged. You may see a "hold" on your card for the $35 but that hold will drop off and you will not have a charge processed.  If you are having a true medical emergency please call 911.     For an urgent face to face visit, [facility] has four urgent care centers for your convenience:   . [facility]    [phone]                  Get Driving Directions  [address] [place], [place] [address] . 10 am to 8 pm Monday-Friday . 12 pm to 8 pm Saturday-Sunday   . [facility] at [facility]  [phone]                  Get Driving Directions  [address] [place], [place] [address] . 8 am to 8 pm Monday-Friday . 9 am to 6 pm Saturday . 11 am to 6 pm Sunday   . [facility] at [facility]  [phone]                  Get Driving Directions   [address] [place], [place] [address] . 8 am to 8 pm Monday-Friday . 8 am to 4 pm Saturday-Sunday    . [facility] at [facility] Directions  [phone]  [address] [place], [place] [address]  . Monday-Friday, 12 PM to 6 PM    Your e-visit answers were reviewed by a board certified advanced clinical practitioner to complete your personal care plan.  Thank you for using e-Visits.

## 2020-02-02 ENCOUNTER — Ambulatory Visit: Payer: Self-pay

## 2020-02-02 ENCOUNTER — Encounter (HOSPITAL_COMMUNITY): Payer: Self-pay

## 2020-02-02 ENCOUNTER — Ambulatory Visit (HOSPITAL_COMMUNITY)
Admission: EM | Admit: 2020-02-02 | Discharge: 2020-02-02 | Disposition: A | Discharge status: Home / Self Care | Payer: BC Managed Care – PPO | Attending: Family Medicine | Admitting: Family Medicine

## 2020-02-02 ENCOUNTER — Other Ambulatory Visit: Payer: Self-pay

## 2020-02-02 DIAGNOSIS — M7522 Bicipital tendinitis, left shoulder: Secondary | ICD-10-CM

## 2020-02-02 MED ORDER — PREDNISONE 50 MG PO TABS: ORAL_TABLET | ORAL | 0 refills | Status: DC

## 2020-02-02 NOTE — ED Provider Notes (Signed)
[facility]    CSN: [id number] Arrival date & time: 02/02/20  1010      History   Chief Complaint Chief Complaint  Patient presents with  . Shoulder Pain    HPI Kathleen [name] is a 39 y.o. female.   Initial [facility] patient visit, with this 39 yo woman complaining of shoulder pain.  Patient presents today with complaints of left shoulder that radiates down her left arm. Patient states she does not remember injuring her arm but runs 5 miles daily. Patient states she has tried OTC Ibuprofen 200 mg x 3 with no relief.   Patient denies: fever, chest pain, shortness of breath, numbness, tingling  Two small children and one adolescent at home.  Pain began a week ago but much worse after carrying 69 yo son yesterday at the [employer] event.     Past Medical History:  Diagnosis Date  . AMA (advanced maternal age) multigravida 35+   . Anginal pain (HCC)   . Anxiety   . Depression   . Headache   . Hx of pyelonephritis   . Hx of varicella   . IBS (irritable bowel syndrome)   . PCOS (polycystic ovarian syndrome)   . Spontaneous vaginal delivery 05/07/2018    There are no problems to display for this patient.   Past Surgical History:  Procedure Laterality Date  . NO PAST SURGERIES      OB History    Gravida  4   Para  3   Term  3   Preterm      AB  1   Living  3     SAB  1   TAB      Ectopic      Multiple  0   Live Births  2            Home Medications    Prior to Admission medications   Medication Sig Start Date End Date Taking? Authorizing Provider  acetaminophen (TYLENOL) 325 MG tablet Take 2 tablets (650 mg total) by mouth every 4 (four) hours as needed (for pain scale < 4). 05/08/18   Huel Cote, MD  buPROPion (WELLBUTRIN XL) 150 MG 24 hr tablet Take 1 tablet (150 mg total) by mouth 2 (two) times daily. Patient taking differently: Take 150 mg by mouth daily. Take with 300mg  to = 450mg  09/12/15   , MD   ibuprofen (ADVIL,MOTRIN) 600 MG tablet Take 1 tablet (600 mg total) by mouth every 6 (six) hours. 05/08/18   Huel Cote, MD  predniSONE (DELTASONE) 50 MG tablet One with food daily for 3-5 days 02/02/20   Huel Cote, MD    Family History Family History  Problem Relation Age of Onset  . Asthma Maternal Aunt   . Heart disease Paternal Grandmother     Social History Social History   Tobacco Use  . Smoking status: Former Smoker    Packs/day: 1.00    Years: 10.00    Pack years: 10.00    Quit date: 03/01/2010    Years since quitting: 9.9  . Smokeless tobacco: Never Used  Substance Use Topics  . Alcohol use: No  . Drug use: Never     Allergies   Phenobarbital   Review of Systems Review of Systems   Physical Exam Triage Vital Signs ED Triage Vitals  Enc Vitals Group     BP 02/02/20 1030 (!) 152/82     Pulse Rate 02/02/20 1030 78  Resp 02/02/20 1030 16     Temp 02/02/20 1030 98.4 F (36.9 C)     Temp Source 02/02/20 1030 Oral     SpO2 02/02/20 1030 100 %     Weight --      Height --      Head Circumference --      Peak Flow --      Pain Score 02/02/20 1043 5     Pain Loc --      Pain Edu? --      Excl. in GC? --    No data found.  Updated Vital Signs BP (!) 152/82 (BP Location: Right Arm)   Pulse 78   Temp 98.4 F (36.9 C) (Oral)   Resp 16   SpO2 100%    Physical Exam Vitals and nursing note reviewed.  Constitutional:      Appearance: Normal appearance. She is normal weight.  Eyes:     Conjunctiva/sclera: Conjunctivae normal.  Pulmonary:     Effort: Pulmonary effort is normal.  Musculoskeletal:        General: Tenderness present. No deformity or signs of injury.     Cervical back: Normal range of motion and neck supple.     Comments: Painful abduction of left shoulder. Tender left long head of biceps.  Skin:    General: Skin is warm.  Neurological:     General: No focal deficit present.     Mental Status: She is alert.   Psychiatric:        Mood and Affect: Mood normal.        Behavior: Behavior normal.      UC Treatments / Results  Labs (all labs ordered are listed, but only abnormal results are displayed) Labs Reviewed - No data to display  EKG   Radiology No results found.  Procedures Procedures (including critical care time)  Medications Ordered in UC Medications - No data to display  Initial Impression / Assessment and Plan / UC Course  I have reviewed the triage vital signs and the nursing notes.  Pertinent labs & imaging results that were available during my care of the patient were reviewed by me and considered in my medical decision making (see chart for details).    Final Clinical Impressions(s) / UC Diagnoses   Final diagnoses:  Biceps tendonitis on left     Discharge Instructions     Remember to move the arm gently several times a day to keep it limber.    ED Prescriptions    Medication Sig Dispense Auth. Provider   predniSONE (DELTASONE) 50 MG tablet One with food daily for 3-5 days 5 tablet Elvina Sidle, MD     PDMP not reviewed this encounter.   Elvina Sidle, MD 02/02/20 1100

## 2020-02-02 NOTE — ED Triage Notes (Signed)
Patient presents today with complaints of left shoulder that radiates down her left arm. Patient states she does not remember injuring her arm but runs 5 miles daily. Patient states she has tried OTC Ibuprofen 200 mg x 3 with no relief.   Patient denies: fever, chest pain, shortness of breath, numbness, tingling

## 2020-02-02 NOTE — Discharge Instructions (Signed)
Remember to move the arm gently several times a day to keep it limber.

## 2020-02-11 ENCOUNTER — Telehealth: Payer: BC Managed Care – PPO | Admitting: Nurse Practitioner

## 2020-02-11 DIAGNOSIS — N3000 Acute cystitis without hematuria: Secondary | ICD-10-CM | POA: Diagnosis not present

## 2020-02-11 MED ORDER — NITROFURANTOIN MONOHYD MACRO 100 MG PO CAPS: 100.0000 mg | ORAL_CAPSULE | Freq: Two times a day (BID) | ORAL | 0 refills | Status: DC

## 2020-02-11 NOTE — Progress Notes (Signed)

## 2020-08-05 ENCOUNTER — Ambulatory Visit: Payer: Self-pay

## 2020-08-05 ENCOUNTER — Ambulatory Visit (INDEPENDENT_AMBULATORY_CARE_PROVIDER_SITE_OTHER): Payer: BLUE CROSS/BLUE SHIELD | Admitting: Orthopaedic Surgery

## 2020-08-05 ENCOUNTER — Encounter: Payer: Self-pay | Admitting: Orthopaedic Surgery

## 2020-08-05 VITALS — BP 155/91 | HR 77 | Ht 69.0 in | Wt 135.0 lb

## 2020-08-05 DIAGNOSIS — M79605 Pain in left leg: Secondary | ICD-10-CM

## 2020-08-06 NOTE — Progress Notes (Signed)
Office Visit Note   Patient: Kathleen [name]           Date of Birth: 09/05/1980           MRN: [id number] Visit Date: 08/05/2020              Requested by: No referring provider defined for this encounter. PCP: Patient, No Pcp Per   Assessment & Plan: Visit Diagnoses:  1. Pain in left leg     Plan: We will set patient up for some physical therapy evaluation of back pain and sciatic pain on the left.  We will recheck her in 6 weeks if she is having persistent problems and MRI imaging lumbar spine can be considered.  Follow-Up Instructions: Return in about 6 weeks (around 09/16/2020).   Orders:  Orders Placed This Encounter  Procedures  . XR Lumbar Spine 2-3 Views  . XR Pelvis 1-2 Views  . Ambulatory referral to Physical Therapy   No orders of the defined types were placed in this encounter.     Procedures: No procedures performed   Clinical Data: No additional findings.   Subjective: Chief Complaint  Patient presents with  . Left Leg - Pain    HPI 40 year old female with back pain left buttock pain left leg pain off-and-on x5 years it started after her pregnancy.  She is a runner and at times she has to skip running for several days until it settles down.  She had some numbness and tingling in her legs at times.  Hard for her to sit she has not noticed any weakness with stairs.  Aleve does tend to take the edge off.  Currently her symptoms of been with her for a week which is longer than it usually last.  Taking care of the 3 different generations in her family and she is requesting evaluation for back and leg problems.  Review of Systems all other systems are Nimish contributory patient's been active healthy and a regular runner stopped intermittently by her recurrent back pain.   Objective: Vital Signs: BP (!) 155/91   Pulse 77   Ht 5\' 9"  (1.753 m)   Wt 135 lb (61.2 kg)   BMI 19.94 kg/m   Physical Exam Constitutional:      Appearance: She is  well-developed.  HENT:     Head: Normocephalic.     Right Ear: External ear normal.     Left Ear: External ear normal.  Eyes:     Pupils: Pupils are equal, round, and reactive to light.  Neck:     Thyroid: No thyromegaly.     Trachea: No tracheal deviation.  Cardiovascular:     Rate and Rhythm: Normal rate.  Pulmonary:     Effort: Pulmonary effort is normal.  Abdominal:     Palpations: Abdomen is soft.  Skin:    General: Skin is warm and dry.  Neurological:     Mental Status: She is alert and oriented to person, place, and time.  Psychiatric:        Mood and Affect: Mood and affect normal.        Behavior: Behavior normal.     Ortho Exam patient has some sciatic notch tenderness on the left trochanteric bursal tenderness negative Faber test.  Normal knee-to-chest.  Knee and ankle jerks are intact she is able to heel and toe walk easily some increased pain with forward flexion fingertips to floor.  Specialty Comments:  No specialty comments available.  Imaging:  No results found.   PMFS History: There are no problems to display for this patient.  Past Medical History:  Diagnosis Date  . AMA (advanced maternal age) multigravida 35+   . Anginal pain (HCC)   . Anxiety   . Depression   . Headache   . Hx of pyelonephritis   . Hx of varicella   . IBS (irritable bowel syndrome)   . PCOS (polycystic ovarian syndrome)   . Spontaneous vaginal delivery 05/07/2018    Family History  Problem Relation Age of Onset  . Asthma Maternal Aunt   . Heart disease Paternal Grandmother     Past Surgical History:  Procedure Laterality Date  . NO PAST SURGERIES     Social History   Occupational History  . Not on file  Tobacco Use  . Smoking status: Former Smoker    Packs/day: 1.00    Years: 10.00    Pack years: 10.00    Quit date: 03/01/2010    Years since quitting: 10.4  . Smokeless tobacco: Never Used  Substance and Sexual Activity  . Alcohol use: No  . Drug use: Never   . Sexual activity: Yes    Birth control/protection: None

## 2020-08-18 ENCOUNTER — Ambulatory Visit: Payer: BLUE CROSS/BLUE SHIELD | Admitting: Rehabilitative and Restorative Service Providers"

## 2020-09-01 ENCOUNTER — Other Ambulatory Visit: Payer: Self-pay

## 2020-09-01 ENCOUNTER — Ambulatory Visit (INDEPENDENT_AMBULATORY_CARE_PROVIDER_SITE_OTHER): Payer: BLUE CROSS/BLUE SHIELD | Admitting: Physical Therapy

## 2020-09-01 DIAGNOSIS — R262 Difficulty in walking, not elsewhere classified: Secondary | ICD-10-CM

## 2020-09-01 DIAGNOSIS — M6281 Muscle weakness (generalized): Secondary | ICD-10-CM | POA: Diagnosis not present

## 2020-09-01 DIAGNOSIS — M25552 Pain in left hip: Secondary | ICD-10-CM

## 2020-09-01 NOTE — Patient Instructions (Signed)
Access Code: [email or web address] URL: [email or web address] Date: 09/01/2020 Prepared by: Narda Amber  Exercises Hooklying Hamstring Stretch with Strap - 2 x daily - 3-5 reps - 20-30 seconds hold Supine ITB Stretch with Strap - 2 x daily - 3 reps - 20-30 seconds hold Supine Figure 4 Piriformis Stretch - 1 x daily - 7 x weekly - 3 sets - 10 reps - 20-3030 seconds hold Seated Hip External Rotation Stretch - 2 x daily - 5 reps - 10 seconds hold Supine Lower Trunk Rotation - 2 x daily - 3-5 reps - 10 seconds hold

## 2020-09-01 NOTE — Therapy (Signed)
[facility] [address] [place], [place], [address] Phone: [phone]   Fax:  [phone]  Physical Therapy Evaluation  Patient Details  Name: Kathleen [name] MRN: [id number] Date of Birth: 06/23/1981 Referring Provider (PT): Annell Greening, MD   Encounter Date: 09/01/2020   PT End of Session - 09/01/20 1623    Visit Number 1    Number of Visits 7    Date for PT Re-Evaluation 10/16/20    Authorization Type 1x/ week for 6 more weeks    PT Start Time 1430    PT Stop Time 1510    PT Time Calculation (min) 40 min    Activity Tolerance Patient tolerated treatment well    Behavior During Therapy [facility] for tasks assessed/performed           Past Medical History:  Diagnosis Date  . AMA (advanced maternal age) multigravida 35+   . Anginal pain (HCC)   . Anxiety   . Depression   . Headache   . Hx of pyelonephritis   . Hx of varicella   . IBS (irritable bowel syndrome)   . PCOS (polycystic ovarian syndrome)   . Spontaneous vaginal delivery 05/07/2018    Past Surgical History:  Procedure Laterality Date  . NO PAST SURGERIES      There were no vitals filed for this visit.    Subjective Assessment - 09/01/20 1439    Subjective Pt arriving to therpay reporting since her last two pregnancies she has been having L buttock pain on and off. Pt reporting over the last 2 months her pain has increased. Pt is also training for a 1/2 marathon.  Pt reporting pain is worse with sitting. Pt reporting she is an advid runner / walker which seems to help.    Pertinent History depression, HA    Limitations Sitting;Other (comment)    Diagnostic tests X-ray    Patient Stated Goals Run my 1/2 marathon on 09/27/2020. Stop having pain in my buttock    Currently in Pain? Yes    Pain Score 3     Pain Location Buttocks    Pain Orientation Left    Pain Descriptors / Indicators Aching    Pain Type Chronic pain    Pain Onset More than a month ago    Pain  Frequency Intermittent    Aggravating Factors  sitting    Pain Relieving Factors changing positions    Effect of Pain on Daily Activities working and taking care of my 3 children              [facility] PT Assessment - 09/01/20 0001      Assessment   Medical Diagnosis M79.605 pain in L LE    Referring Provider (PT) Annell Greening, MD    Hand Dominance Right    Prior Therapy no      Precautions   Precautions None      Restrictions   Weight Bearing Restrictions No      Balance Screen   Has the patient fallen in the past 6 months No    Is the patient reluctant to leave their home because of a fear of falling?  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[employer]   Overall Cognitive Status Within  Functional Limits for tasks assessed      Observation/Other Assessments   Focus on Therapeutic Outcomes (FOTO)  %61 (predicted 78%)      Posture/Postural Control   Posture/Postural Control No significant limitations      ROM / Strength   AROM / PROM / Strength AROM;Strength      Flexibility   Soft Tissue Assessment /Muscle Length yes    Hamstrings R: 80 degrees,  L 60 degrees   opposite knee bent     Palpation   Palpation comment TTP: Left posterior hamstring tendons, left piriformis      Special Tests    Special Tests Hip Special Tests    Hip Special Tests  SI Compression;Hip Scouring;Patrick (FABER) Test      Luisa Hart [facility]) Test   Findings Positive    Side Left      SI Compression   Findings Negative    Side Left      Hip Scouring   Findings Negative    Side Left      Ambulation/Gait   Gait Pattern Decreased stance time - left;Decreased weight shift to left;Antalgic                      Objective measurements completed on examination: See above findings.               PT Education -  09/01/20 1622    Education Details PT POC, HEP, basic anatomy and physiology    Person(s) Educated Patient    Methods Explanation;Demonstration;Handout;Verbal cues    Comprehension Verbalized understanding;Returned demonstration            PT Short Term Goals - 09/01/20 1624      PT SHORT TERM GOAL #1   Title Pt will be independent in her initial HEP.    Time 3    Period Weeks    Status New    Target Date 09/25/20             PT Long Term Goals - 09/01/20 1624      PT LONG TERM GOAL #1   Title Pt will be able to improve her Left hamstring flexibility to >/= 80  degrees to improve functional mobility.    Baseline 60 degrees on L LE compared to 80 degrees on the R LE    Time 6    Period Weeks    Status New    Target Date 10/16/20      PT LONG TERM GOAL #2   Title Pt will be able to improve her L LE strength to grossly 5/5.    Baseline 4/5 with pain noted with MMT    Time 6    Period Weeks    Status New    Target Date 10/16/20      PT LONG TERM GOAL #3   Title Pt will be able to return to running 3 miles with pain </= 2/10 in left hip/buttock.    Time 6    Period Weeks    Status New    Target Date 10/16/20      PT LONG TERM GOAL #4   Title Pt will improve her FOTO from 61% to 78% function.    Time 6    Period Weeks    Status New    Target Date 10/16/20                  Plan - 09/01/20 1629    Clinical  Impression Statement Pt arriving to therapy reporting pain in her left gluteal region. Pt reporting pain began after her second child was born almost 5 years ago. Pt since has had a 3rd child which is 5 years old. Pt with pin point tenderness over left superior hamstring tendons and left piriformis. Pt with 4/5 weakness noted in left hip musculature compared to R LE. Pt recently ran her first 1/2 marathon and is preparing to run her second on 09/27/2020. X-ray of pelvis and hips reveal no acute fractures. Pt was issued a HEP and advised to hold off on  running for the next week. Skilled PT recommended 1x/ week for 6 weeks.    Examination-Activity Limitations Sit;Other    Examination-Participation Restrictions Other;Driving;Occupation    Stability/Clinical Decision Making Stable/Uncomplicated    Clinical Decision Making Low    Rehab Potential Excellent    PT Frequency 1x / week    PT Duration 6 weeks    PT Treatment/Interventions Electrical Stimulation;Moist Heat;Ultrasound;Iontophoresis 4mg /ml Dexamethasone;Gait training;Stair training;Functional mobility training;Therapeutic activities;Patient/family education;Neuromuscular re-education;Balance training;Therapeutic exercise;Manual techniques;Passive range of motion;Dry needling;Taping    PT Next Visit Plan Access for DN, review HEP, LE stretching, L LE strengthening, Manual therapy as needed    PT Home Exercise Plan [email or web address]    Consulted and Agree with Plan of Care Patient           Patient will benefit from skilled therapeutic intervention in order to improve the following deficits and impairments:  Pain,Impaired flexibility,Difficulty walking,Decreased strength,Decreased range of motion  Visit Diagnosis: Pain in left hip  Muscle weakness (generalized)  Difficulty in walking, not elsewhere classified     Problem List There are no problems to display for this patient.   , PT, MPT 09/01/2020, 4:43 PM  [facility] [address] [place], [place], [place] Phone: [phone]   Fax:  [phone]  Name: Kathleen [name] MRN: Laurel Dimmer Date of Birth: 05/05/1981

## 2020-09-02 ENCOUNTER — Encounter: Payer: Self-pay | Admitting: Orthopaedic Surgery

## 2020-09-02 MED ORDER — PREDNISONE 10 MG (21) PO TBPK: ORAL_TABLET | ORAL | 0 refills | Status: DC

## 2020-09-10 ENCOUNTER — Ambulatory Visit: Payer: BLUE CROSS/BLUE SHIELD | Admitting: Physical Therapy

## 2020-09-10 ENCOUNTER — Other Ambulatory Visit: Payer: Self-pay

## 2020-09-10 DIAGNOSIS — M6281 Muscle weakness (generalized): Secondary | ICD-10-CM | POA: Diagnosis not present

## 2020-09-10 DIAGNOSIS — M25552 Pain in left hip: Secondary | ICD-10-CM

## 2020-09-10 DIAGNOSIS — R262 Difficulty in walking, not elsewhere classified: Secondary | ICD-10-CM | POA: Diagnosis not present

## 2020-09-10 NOTE — Patient Instructions (Signed)
Access Code: [email or web address] URL: [email or web address] Date: 09/10/2020 Prepared by: Ivery Quale  Exercises Hooklying Hamstring Stretch with Strap - 2 x daily - 3-5 reps - 20-30 seconds hold Supine ITB Stretch with Strap - 2 x daily - 3 reps - 20-30 seconds hold Supine Figure 4 Piriformis Stretch - 1 x daily - 7 x weekly - 3 sets - 10 reps - 20-3030 seconds hold Seated Hip External Rotation Stretch - 2 x daily - 5 reps - 10 seconds hold Figure 4 Bridge - 2 x daily - 6 x weekly - 1-2 sets - 10 reps - 5 hold Bird Dog - 2 x daily - 6 x weekly - 1-2 sets - 10 reps - 5 sec hold Sidelying Hip Abduction - 2 x daily - 6 x weekly - 2-3 sets - 10 reps

## 2020-09-10 NOTE — Therapy (Addendum)
[facility] Physical Therapy [address] [place], [place], [address] Phone: [phone]   Fax:  [phone]  Physical Therapy Treatment/Discharge addendum PHYSICAL THERAPY DISCHARGE SUMMARY  Visits from Start of Care: 2  Current functional level related to goals / functional outcomes: See below   Remaining deficits: See below   Education / Equipment: HEP Plan:                                                    Patient goals were not met. Patient is being discharged due to not returning since the last visit.  ?????   Elsie Ra, PT, DPT 11/16/20 8:36 AM     Patient Details  Name: Kathleen [name] MRN: [id number] Date of Birth: [birth year] Referring Provider (PT): Rodell Perna, MD   Encounter Date: 09/10/2020   PT End of Session - 09/10/20 1557    Visit Number 2    Number of Visits 7    Date for PT Re-Evaluation 10/16/20    Authorization Type 1x/ week for 6 more weeks    PT Start Time 1515    PT Stop Time 1600    PT Time Calculation (min) 45 min    Activity Tolerance Patient tolerated treatment well    Behavior During Therapy [facility] for tasks assessed/performed           Past Medical History:  Diagnosis Date  . AMA (advanced maternal age) multigravida 58+   . Anginal pain ([place])   . Anxiety   . Depression   . Headache   . Hx of pyelonephritis   . Hx of varicella   . IBS (irritable bowel syndrome)   . PCOS (polycystic ovarian syndrome)   . Spontaneous vaginal delivery 05/07/2018    Past Surgical History:  Procedure Laterality Date  . NO PAST SURGERIES      There were no vitals filed for this visit.   Subjective Assessment - 09/10/20 1521    Subjective Relays she is now on predinsone and this is overall helping some. The pain down her leg is only intermittent and mostly with sitting or pushing her son in stroller. She denies any radiculopaty or back pain upon arrival today. She has not ran any due to she will have pain with this  still.    Pertinent History depression, HA    Limitations Sitting;Other (comment)    Diagnostic tests X-ray    Patient Stated Goals Run my 1/2 marathon on 09/27/2020. Stop having pain in my buttock    Pain Onset More than a month ago             [name] Adult PT Treatment/Exercise - 09/10/20 0001      Exercises   Exercises Knee/Hip      Knee/Hip Exercises: Stretches   Active Hamstring Stretch Left;3 reps;30 seconds    Active Hamstring Stretch Limitations supine with strap    Piriformis Stretch Left;3 reps;30 seconds      Knee/Hip Exercises: Aerobic   Tread Mill 2.5 mph for 5 min      Knee/Hip Exercises: Supine   Single Leg Bridge Left;2 sets;10 reps      Knee/Hip Exercises: Sidelying   Hip ABduction Left;2 sets;10 reps      Knee/Hip Exercises: Prone   Other Prone Exercises quadriped bird dogs X 10 reps holding 5 sec  Modalities   Modalities Moist Heat      Moist Heat Therapy   Number Minutes Moist Heat 7 Minutes    Moist Heat Location Hip      Manual Therapy   Manual therapy comments long axis distraction, STM and trigger point release techniques to Lt glute/piriformis, Sidelying lumbar rotation mobs grade 3                    PT Short Term Goals - 09/01/20 1624      PT SHORT TERM GOAL #1   Title Pt will be independent in her initial HEP.    Time 3    Period Weeks    Status New    Target Date 09/25/20             PT Long Term Goals - 09/01/20 1624      PT LONG TERM GOAL #1   Title Pt will be able to improve her Left hamstring flexibility to >/= 80  degrees to improve functional mobility.    Baseline 60 degrees on L LE compared to 80 degrees on the R LE    Time 6    Period Weeks    Status New    Target Date 10/16/20      PT LONG TERM GOAL #2   Title Pt will be able to improve her L LE strength to grossly 5/5.    Baseline 4/5 with pain noted with MMT    Time 6    Period Weeks    Status New    Target Date 10/16/20      PT LONG TERM  GOAL #3   Title Pt will be able to return to running 3 miles with pain </= 2/10 in left hip/buttock.    Time 6    Period Weeks    Status New    Target Date 10/16/20      PT LONG TERM GOAL #4   Title Pt will improve her FOTO from 61% to 78% function.    Time 6    Period Weeks    Status New    Target Date 10/16/20                 Plan - 09/10/20 1557    Clinical Impression Statement She appears to be making some early progress and was not having any radiculopathy or glute pain today however she is now on prednisone so this could also be helping. She has been compliance with her HEP and she feels the piriformis stretching is helping. Upon strength assessment she was found to have weakness in her left hip paticularly with abduction and extension. We added 3 strengthening exercises into her HEP for this. She was treated today with manual therapy for piriformis release techniques today and she was given tennis ball to take home to trial this as well.    Examination-Activity Limitations Sit;Other    Examination-Participation Restrictions Other;Driving;Occupation    Stability/Clinical Decision Making Stable/Uncomplicated    Rehab Potential Excellent    PT Frequency 1x / week    PT Duration 6 weeks    PT Treatment/Interventions Electrical Stimulation;Moist Heat;Ultrasound;Iontophoresis 16m/ml Dexamethasone;Gait training;Stair training;Functional mobility training;Therapeutic activities;Patient/family education;Neuromuscular re-education;Balance training;Therapeutic exercise;Manual techniques;Passive range of motion;Dry needling;Taping    PT Next Visit Plan Access for DN,  LE stretching, L LE strengthening, Manual therapy as needed    PT Home Exercise Plan [email or web address]    Consulted and Agree with Plan of Care Patient  Patient will benefit from skilled therapeutic intervention in order to improve the following deficits and impairments:  Pain,Impaired flexibility,Difficulty  walking,Decreased strength,Decreased range of motion  Visit Diagnosis: Pain in left hip  Muscle weakness (generalized)  Difficulty in walking, not elsewhere classified     Problem List There are no problems to display for this patient.   [name], PT,DPT 09/10/2020, 4:19 PM   [facility] Physical Therapy [address] [place], [place], [address] Phone: [phone]   Fax:  [phone]  Name: MC HOLLEN MRN: [id number] Date of Birth: [birth year]

## 2020-09-14 ENCOUNTER — Encounter: Payer: BLUE CROSS/BLUE SHIELD | Admitting: Physical Therapy

## 2020-09-16 ENCOUNTER — Ambulatory Visit: Payer: BLUE CROSS/BLUE SHIELD | Admitting: Orthopaedic Surgery

## 2020-09-21 ENCOUNTER — Encounter: Payer: BLUE CROSS/BLUE SHIELD | Admitting: Physical Therapy

## 2020-09-28 ENCOUNTER — Encounter: Payer: BLUE CROSS/BLUE SHIELD | Admitting: Physical Therapy

## 2020-09-28 ENCOUNTER — Telehealth: Payer: Self-pay | Admitting: Physical Therapy

## 2020-09-28 NOTE — Telephone Encounter (Signed)
I called pt to follow up after her missed physical therapy appointment on 09/28/2020 at 1:00pm. I left a message reminding pt of her upcoming appointment with Dr. Ophelia Charter on Wednesday  At 10:45 and her next PT appointment on 10/05/2020 at 1:00pm.   Narda Amber, PT,  MPT 09/28/20 1:28 PM

## 2020-09-30 ENCOUNTER — Ambulatory Visit: Payer: BLUE CROSS/BLUE SHIELD | Admitting: Orthopaedic Surgery

## 2020-10-05 ENCOUNTER — Encounter: Payer: BLUE CROSS/BLUE SHIELD | Admitting: Physical Therapy

## 2020-10-05 ENCOUNTER — Telehealth: Payer: Self-pay | Admitting: Physical Therapy

## 2020-10-05 NOTE — Telephone Encounter (Signed)
I called to follow up about pt's missed physical therapy visit today at 1:00pm. I left message that pt has no further physical therapy appointments made and if she needed any further visits to please call our clinic at [phone].   Narda Amber, PT, MPT 10/05/20 1:19 PM

## 2020-10-20 DIAGNOSIS — M9904 Segmental and somatic dysfunction of sacral region: Secondary | ICD-10-CM | POA: Diagnosis not present

## 2020-10-20 DIAGNOSIS — M9903 Segmental and somatic dysfunction of lumbar region: Secondary | ICD-10-CM | POA: Diagnosis not present

## 2020-10-20 DIAGNOSIS — M9905 Segmental and somatic dysfunction of pelvic region: Secondary | ICD-10-CM | POA: Diagnosis not present

## 2020-10-20 DIAGNOSIS — M545 Low back pain, unspecified: Secondary | ICD-10-CM | POA: Diagnosis not present

## 2020-10-22 DIAGNOSIS — M9905 Segmental and somatic dysfunction of pelvic region: Secondary | ICD-10-CM | POA: Diagnosis not present

## 2020-10-22 DIAGNOSIS — M9904 Segmental and somatic dysfunction of sacral region: Secondary | ICD-10-CM | POA: Diagnosis not present

## 2020-10-22 DIAGNOSIS — M545 Low back pain, unspecified: Secondary | ICD-10-CM | POA: Diagnosis not present

## 2020-10-22 DIAGNOSIS — M9903 Segmental and somatic dysfunction of lumbar region: Secondary | ICD-10-CM | POA: Diagnosis not present

## 2020-10-26 DIAGNOSIS — M9904 Segmental and somatic dysfunction of sacral region: Secondary | ICD-10-CM | POA: Diagnosis not present

## 2020-10-26 DIAGNOSIS — M9905 Segmental and somatic dysfunction of pelvic region: Secondary | ICD-10-CM | POA: Diagnosis not present

## 2020-10-26 DIAGNOSIS — M9903 Segmental and somatic dysfunction of lumbar region: Secondary | ICD-10-CM | POA: Diagnosis not present

## 2020-10-26 DIAGNOSIS — M545 Low back pain, unspecified: Secondary | ICD-10-CM | POA: Diagnosis not present

## 2020-11-02 DIAGNOSIS — M9903 Segmental and somatic dysfunction of lumbar region: Secondary | ICD-10-CM | POA: Diagnosis not present

## 2020-11-02 DIAGNOSIS — M9905 Segmental and somatic dysfunction of pelvic region: Secondary | ICD-10-CM | POA: Diagnosis not present

## 2020-11-02 DIAGNOSIS — M9904 Segmental and somatic dysfunction of sacral region: Secondary | ICD-10-CM | POA: Diagnosis not present

## 2020-11-02 DIAGNOSIS — M545 Low back pain, unspecified: Secondary | ICD-10-CM | POA: Diagnosis not present

## 2021-01-04 DIAGNOSIS — Z130 Encounter for screening for diseases of the blood and blood-forming organs and certain disorders involving the immune mechanism: Secondary | ICD-10-CM | POA: Diagnosis not present

## 2021-01-04 DIAGNOSIS — Z6820 Body mass index (BMI) 20.0-20.9, adult: Secondary | ICD-10-CM | POA: Diagnosis not present

## 2021-01-04 DIAGNOSIS — Z01419 Encounter for gynecological examination (general) (routine) without abnormal findings: Secondary | ICD-10-CM | POA: Diagnosis not present

## 2021-01-04 DIAGNOSIS — Z1231 Encounter for screening mammogram for malignant neoplasm of breast: Secondary | ICD-10-CM | POA: Diagnosis not present

## 2021-01-04 DIAGNOSIS — Z1389 Encounter for screening for other disorder: Secondary | ICD-10-CM | POA: Diagnosis not present

## 2021-01-24 ENCOUNTER — Encounter: Payer: Self-pay | Admitting: Physician Assistant

## 2021-01-24 ENCOUNTER — Telehealth: Payer: BC Managed Care – PPO | Admitting: Physician Assistant

## 2021-01-24 DIAGNOSIS — H00014 Hordeolum externum left upper eyelid: Secondary | ICD-10-CM

## 2021-01-24 MED ORDER — NEOMYCIN-POLYMYXIN-HC OP SUSP: 2.0000 [drp] | OPHTHALMIC | 0 refills | Status: AC

## 2021-01-24 NOTE — Progress Notes (Signed)
  E-Visit for Stye   We are sorry that you are not feeling well. Here is how we plan to help!  Based on what you have shared with me it looks like you have a stye.  A stye is an inflammation of the eyelid.  It is often a red, painful lump near the edge of the eyelid that may look like a boil or a pimple.  A stye develops when an infection occurs at the base of an eyelash.   We have made appropriate suggestions for you based upon your presentation: Your symptoms may indicate an infection of the sclera.  The use of anti-inflammatory and antibiotic eye drops for a week will help resolve this condition.  I have sent in neomycin-polymyxin HC opthalmic suspension, two to three drops in the affected eye every 4 hours.  If your symptoms do not improve over the next two to three days you should be seen in your doctor's office.  HOME CARE:  Wash your hands often! Let the stye open on its own. Don't squeeze or open it. Don't rub your eyes. This can irritate your eyes and let in bacteria.  If you need to touch your eyes, wash your hands first. Don't wear eye makeup or contact lenses until the area has healed.  GET HELP RIGHT AWAY IF:  Your symptoms do not improve. You develop blurred or loss of vision. Your symptoms worsen (increased discharge, pain or redness).   Thank you for choosing an e-visit.  Your e-visit answers were reviewed by a board certified advanced clinical practitioner to complete your personal care plan. Depending upon the condition, your plan could have included both over the counter or prescription medications.  Please review your pharmacy choice. Make sure the pharmacy is open so you can pick up prescription now. If there is a problem, you may contact your provider through [employer] and have the prescription routed to another pharmacy.  Your safety is important to [place]. If you have drug allergies check your prescription carefully.   For the next 24 hours you can use  MyChart to ask questions about today's visit, request a non-urgent call back, or ask for a work or school excuse. You will get an email in the next two days asking about your experience. I hope that your e-visit has been valuable and will speed your recovery. I spent 5-10 minutes on review and completion of this note- Illa Level [facility]

## 2021-01-27 NOTE — Progress Notes (Signed)
Per fax received, prescribed med was not available at pt's pharmacy. Called pharmacy to change RX but was informed that med is now ready. Called pt and left VM that med should be ready for her at pharmacy.

## 2021-06-06 ENCOUNTER — Telehealth: Payer: BC Managed Care – PPO | Admitting: Physician Assistant

## 2021-06-06 DIAGNOSIS — H1033 Unspecified acute conjunctivitis, bilateral: Secondary | ICD-10-CM | POA: Diagnosis not present

## 2021-06-07 MED ORDER — POLYMYXIN B-TRIMETHOPRIM 10000-0.1 UNIT/ML-% OP SOLN: OPHTHALMIC | 0 refills | Status: DC

## 2021-06-07 NOTE — Progress Notes (Addendum)

## 2021-06-07 NOTE — Progress Notes (Signed)
I have spent 5 minutes in review of e-visit questionnaire, review and updating patient chart, medical decision making and response to patient.   Juelz Whittenberg Cody Demetrias Goodbar, PA-C    

## 2021-06-20 ENCOUNTER — Telehealth: Payer: BC Managed Care – PPO | Admitting: Family

## 2021-06-20 DIAGNOSIS — J019 Acute sinusitis, unspecified: Secondary | ICD-10-CM

## 2021-06-20 MED ORDER — AMOXICILLIN-POT CLAVULANATE 875-125 MG PO TABS: 1.0000 | ORAL_TABLET | Freq: Two times a day (BID) | ORAL | 0 refills | Status: DC

## 2021-06-20 NOTE — Progress Notes (Signed)

## 2021-06-23 ENCOUNTER — Telehealth: Payer: BLUE CROSS/BLUE SHIELD | Admitting: Physician Assistant

## 2021-06-23 DIAGNOSIS — J019 Acute sinusitis, unspecified: Secondary | ICD-10-CM

## 2021-06-23 NOTE — Progress Notes (Signed)
Based on what you shared with me, I feel your condition warrants further evaluation and I recommend that you be seen for a face to face visit.  Please contact your primary care physician practice to be seen. Many offices offer virtual options to be seen via video if you are not comfortable going in person to a medical facility at this time.  NOTE: You will NOT be charged for this eVisit.  If you do not have a PCP, [facility] offers a free physician referral service available at [phone]. Our trained staff has the experience, knowledge and resources to put you in touch with a physician who is right for you.   If you are having a true medical emergency please call 911.      For an urgent face to face visit, [place] has six urgent care centers for your convenience:     [facility] at [facility] Directions [phone] [address] [place], [place] [address]    [facility]) Get Driving Directions [phone] [address] [place], [place] [address]  [facility]) Get Driving Directions [phone] [address] [place],  [place]  [address]  [facility] Urgent Care at [facility] Get Driving Directions [phone] [address] [place], [place] [address]   [facility] Urgent Care at [facility] Get Driving Directions  [phone] [address] [place], [place] [address]   [facility] Urgent Care at [facility] Directions [phone] [address], [address] [place], [place] [address]  Your MyChart E-visit questionnaire answers were reviewed by a board certified advanced clinical practitioner to complete your personal care plan based on your specific symptoms.  Thank you for using e-Visits.   I provided 5 minutes of non face-to-face time during this encounter for chart review and  documentation.

## 2021-06-25 DIAGNOSIS — H6692 Otitis media, unspecified, left ear: Secondary | ICD-10-CM | POA: Diagnosis not present

## 2021-06-30 ENCOUNTER — Telehealth: Payer: BC Managed Care – PPO | Admitting: Family

## 2021-06-30 DIAGNOSIS — A084 Viral intestinal infection, unspecified: Secondary | ICD-10-CM | POA: Diagnosis not present

## 2021-06-30 MED ORDER — ONDANSETRON HCL 4 MG PO TABS: 4.0000 mg | ORAL_TABLET | Freq: Three times a day (TID) | ORAL | 0 refills | Status: DC | PRN

## 2021-06-30 NOTE — Progress Notes (Signed)
E-Visit for Vomiting  We are sorry that you are not feeling well. Here is how we plan to help!  Based on what you have shared with me it looks like you have a Virus that is irritating your GI tract.  Vomiting is the forceful emptying of a portion of the stomach's content through the mouth.  Although nausea and vomiting can make you feel miserable, it's important to remember that these are not diseases, but rather symptoms of an underlying illness.  When we treat short term symptoms, we always caution that any symptoms that persist should be fully evaluated in a medical office.  I have prescribed a medication that will help alleviate your symptoms and allow you to stay hydrated:  Zofran 4 mg 1 tablet every 8 hours as needed for nausea and vomiting  HOME CARE: Drink clear liquids.  This is very important! Dehydration (the lack of fluid) can lead to a serious complication.  Start off with 1 tablespoon every 5 minutes for 8 hours. You may begin eating bland foods after 8 hours without vomiting.  Start with saltine crackers, white bread, rice, mashed potatoes, applesauce. After 48 hours on a bland diet, you may resume a normal diet. Try to go to sleep.  Sleep often empties the stomach and relieves the need to vomit.  GET HELP RIGHT AWAY IF:  Your symptoms do not improve or worsen within 2 days after treatment. You have a fever for over 3 days. You cannot keep down fluids after trying the medication.  MAKE SURE YOU:  Understand these instructions. Will watch your condition. Will get help right away if you are not doing well or get worse.   Thank you for choosing an e-visit.  Your e-visit answers were reviewed by a board certified advanced clinical practitioner to complete your personal care plan. Depending upon the condition, your plan could have included both over the counter or prescription medications.  Please review your pharmacy choice. Make sure the pharmacy is open so you can pick  up prescription now. If there is a problem, you may contact your provider through MyChart messaging and have the prescription routed to another pharmacy.  Your safety is important to us. If you have drug allergies check your prescription carefully.   For the next 24 hours you can use MyChart to ask questions about today's visit, request a non-urgent call back, or ask for a work or school excuse. You will get an email in the next two days asking about your experience. I hope that your e-visit has been valuable and will speed your recovery.   Approximately 5 minutes was spent documenting and reviewing patient's chart.    

## 2021-07-11 ENCOUNTER — Telehealth: Payer: BC Managed Care – PPO | Admitting: Nurse Practitioner

## 2021-07-11 DIAGNOSIS — R1030 Lower abdominal pain, unspecified: Secondary | ICD-10-CM

## 2021-07-11 DIAGNOSIS — R197 Diarrhea, unspecified: Secondary | ICD-10-CM

## 2021-07-11 MED ORDER — AZITHROMYCIN 500 MG PO TABS: 500.0000 mg | ORAL_TABLET | Freq: Every day | ORAL | 0 refills | Status: AC

## 2021-07-11 NOTE — Progress Notes (Signed)
We are sorry that you are not feeling well.  Here is how we plan to help!  Based on what you have shared with me it looks like you have Acute Infectious Diarrhea.  Most cases of acute diarrhea are due to infections with virus and bacteria and are self-limited conditions lasting less than 14 days.  For your symptoms you may take Imodium 2 mg tablets that are over the counter at your local pharmacy. Take two tablet now and then one after each loose stool up to 6 a day.   I have prescribed azithromycin 500 mg daily for 3 days  I see that you completed an evisit for gastroenteritis on 06/30/2021. If the medication prescribed does not help improve your symptoms over the next 24-48 hours, I recommend a follow-up with your primary care physician or a local urgent care for further evaluation.   HOME CARE We recommend changing your diet to help with your symptoms for the next few days. Drink plenty of fluids that contain water salt and sugar. Sports drinks such as Gatorade may help.  You may try broths, soups, bananas, applesauce, soft breads, mashed potatoes or crackers.  You are considered infectious for as long as the diarrhea continues. Hand washing or use of alcohol based hand sanitizers is recommend. It is best to stay out of work or school until your symptoms stop.   GET HELP RIGHT AWAY If you have dark yellow colored urine or do not pass urine frequently you should drink more fluids.   If your symptoms worsen  If you feel like you are going to pass out (faint) You have a new problem  MAKE SURE YOU  Understand these instructions. Will watch your condition. Will get help right away if you are not doing well or get worse.  Thank you for choosing an e-visit.  Your e-visit answers were reviewed by a board certified advanced clinical practitioner to complete your personal care plan. Depending upon the condition, your plan could have included both over the counter or prescription  medications.  Please review your pharmacy choice. Make sure the pharmacy is open so you can pick up prescription now. If there is a problem, you may contact your provider through [employer] and have the prescription routed to another pharmacy.  Your safety is important to [place]. If you have drug allergies check your prescription carefully.   For the next 24 hours you can use MyChart to ask questions about today's visit, request a non-urgent call back, or ask for a work or school excuse. You will get an email in the next two days asking about your experience. I hope that your e-visit has been valuable and will speed your recovery.   I have spent at least 5 minutes reviewing and documenting in the patient's chart.

## 2021-08-30 ENCOUNTER — Telehealth: Payer: BC Managed Care – PPO | Admitting: Physician Assistant

## 2021-08-30 DIAGNOSIS — J069 Acute upper respiratory infection, unspecified: Secondary | ICD-10-CM | POA: Diagnosis not present

## 2021-08-30 MED ORDER — IPRATROPIUM BROMIDE 0.03 % NA SOLN: 2.0000 | Freq: Two times a day (BID) | NASAL | 0 refills | Status: DC

## 2021-08-30 MED ORDER — BENZONATATE 100 MG PO CAPS: 100.0000 mg | ORAL_CAPSULE | Freq: Three times a day (TID) | ORAL | 0 refills | Status: DC | PRN

## 2021-08-30 NOTE — Progress Notes (Signed)

## 2021-09-12 ENCOUNTER — Telehealth: Payer: BC Managed Care – PPO | Admitting: Nurse Practitioner

## 2021-09-12 DIAGNOSIS — R051 Acute cough: Secondary | ICD-10-CM

## 2021-09-12 MED ORDER — BENZONATATE 100 MG PO CAPS: 100.0000 mg | ORAL_CAPSULE | Freq: Three times a day (TID) | ORAL | 0 refills | Status: AC | PRN

## 2021-09-12 MED ORDER — PREDNISONE 10 MG (21) PO TBPK: ORAL_TABLET | ORAL | 0 refills | Status: AC

## 2021-09-12 NOTE — Progress Notes (Signed)
We are sorry that you are not feeling well.  Here is how we plan to help!  Based on your presentation I believe you most likely have A cough due to a virus.  This is called viral bronchitis and is best treated by rest, plenty of fluids and control of the cough.  You may use Ibuprofen or Tylenol as directed to help your symptoms.     In addition you may use A prescription cough medication called Tessalon Perles 100mg. You may take 1-2 capsules every 8 hours as needed for your cough.  Prednisone 10 mg daily for 6 days (see taper instructions below)  Directions for 6 day taper: Day 1: 2 tablets before breakfast, 1 after both lunch & dinner and 2 at bedtime Day 2: 1 tab before breakfast, 1 after both lunch & dinner and 2 at bedtime Day 3: 1 tab at each meal & 1 at bedtime Day 4: 1 tab at breakfast, 1 at lunch, 1 at bedtime Day 5: 1 tab at breakfast & 1 tab at bedtime Day 6: 1 tab at breakfast  From your responses in the eVisit questionnaire you describe inflammation in the upper respiratory tract which is causing a significant cough.  This is commonly called Bronchitis and has four common causes:   Allergies Viral Infections Acid Reflux Bacterial Infection Allergies, viruses and acid reflux are treated by controlling symptoms or eliminating the cause. An example might be a cough caused by taking certain blood pressure medications. You stop the cough by changing the medication. Another example might be a cough caused by acid reflux. Controlling the reflux helps control the cough.  USE OF BRONCHODILATOR ("RESCUE") INHALERS: There is a risk from using your bronchodilator too frequently.  The risk is that over-reliance on a medication which only relaxes the muscles surrounding the breathing tubes can reduce the effectiveness of medications prescribed to reduce swelling and congestion of the tubes themselves.  Although you feel brief relief from the bronchodilator inhaler, your asthma may actually be  worsening with the tubes becoming more swollen and filled with mucus.  This can delay other crucial treatments, such as oral steroid medications. If you need to use a bronchodilator inhaler daily, several times per day, you should discuss this with your provider.  There are probably better treatments that could be used to keep your asthma under control.     HOME CARE Only take medications as instructed by your medical team. Complete the entire course of an antibiotic. Drink plenty of fluids and get plenty of rest. Avoid close contacts especially the very young and the elderly Cover your mouth if you cough or cough into your sleeve. Always remember to wash your hands A steam or ultrasonic humidifier can help congestion.   GET HELP RIGHT AWAY IF: You develop worsening fever. You become short of breath You cough up blood. Your symptoms persist after you have completed your treatment plan MAKE SURE YOU  Understand these instructions. Will watch your condition. Will get help right away if you are not doing well or get worse.    Thank you for choosing an e-visit.  Your e-visit answers were reviewed by a board certified advanced clinical practitioner to complete your personal care plan. Depending upon the condition, your plan could have included both over the counter or prescription medications.  Please review your pharmacy choice. Make sure the pharmacy is open so you can pick up prescription now. If there is a problem, you may contact your provider   through MyChart messaging and have the prescription routed to another pharmacy.  Your safety is important to us. If you have drug allergies check your prescription carefully.   For the next 24 hours you can use MyChart to ask questions about today's visit, request a non-urgent call back, or ask for a work or school excuse. You will get an email in the next two days asking about your experience. I hope that your e-visit has been valuable and will  speed your recovery.  I have spent at least 5 minutes reviewing and documenting in the patient's chart.   

## 2021-10-20 DIAGNOSIS — E559 Vitamin D deficiency, unspecified: Secondary | ICD-10-CM | POA: Diagnosis not present

## 2021-10-20 DIAGNOSIS — M2550 Pain in unspecified joint: Secondary | ICD-10-CM | POA: Diagnosis not present

## 2021-10-20 DIAGNOSIS — R209 Unspecified disturbances of skin sensation: Secondary | ICD-10-CM | POA: Diagnosis not present

## 2021-10-20 DIAGNOSIS — R5383 Other fatigue: Secondary | ICD-10-CM | POA: Diagnosis not present

## 2021-11-16 DIAGNOSIS — M9904 Segmental and somatic dysfunction of sacral region: Secondary | ICD-10-CM | POA: Diagnosis not present

## 2021-11-16 DIAGNOSIS — M5442 Lumbago with sciatica, left side: Secondary | ICD-10-CM | POA: Diagnosis not present

## 2021-11-16 DIAGNOSIS — M9902 Segmental and somatic dysfunction of thoracic region: Secondary | ICD-10-CM | POA: Diagnosis not present

## 2021-11-16 DIAGNOSIS — M9905 Segmental and somatic dysfunction of pelvic region: Secondary | ICD-10-CM | POA: Diagnosis not present

## 2021-12-16 ENCOUNTER — Telehealth: Payer: BC Managed Care – PPO | Admitting: Emergency Medicine

## 2021-12-16 DIAGNOSIS — R1110 Vomiting, unspecified: Secondary | ICD-10-CM

## 2021-12-16 MED ORDER — ONDANSETRON HCL 4 MG PO TABS: 4.0000 mg | ORAL_TABLET | Freq: Three times a day (TID) | ORAL | 0 refills | Status: DC | PRN

## 2021-12-16 NOTE — Progress Notes (Signed)

## 2022-01-06 DIAGNOSIS — Z01419 Encounter for gynecological examination (general) (routine) without abnormal findings: Secondary | ICD-10-CM | POA: Diagnosis not present

## 2022-01-06 DIAGNOSIS — Z681 Body mass index (BMI) 19 or less, adult: Secondary | ICD-10-CM | POA: Diagnosis not present

## 2022-01-06 DIAGNOSIS — Z130 Encounter for screening for diseases of the blood and blood-forming organs and certain disorders involving the immune mechanism: Secondary | ICD-10-CM | POA: Diagnosis not present

## 2022-01-06 DIAGNOSIS — R898 Other abnormal findings in specimens from other organs, systems and tissues: Secondary | ICD-10-CM | POA: Diagnosis not present

## 2022-01-06 DIAGNOSIS — Z1389 Encounter for screening for other disorder: Secondary | ICD-10-CM | POA: Diagnosis not present

## 2022-01-06 DIAGNOSIS — Z1231 Encounter for screening mammogram for malignant neoplasm of breast: Secondary | ICD-10-CM | POA: Diagnosis not present

## 2022-01-14 ENCOUNTER — Other Ambulatory Visit: Payer: Self-pay | Admitting: Obstetrics and Gynecology

## 2022-01-14 DIAGNOSIS — R928 Other abnormal and inconclusive findings on diagnostic imaging of breast: Secondary | ICD-10-CM

## 2022-01-21 ENCOUNTER — Ambulatory Visit
Admission: RE | Admit: 2022-01-21 | Discharge: 2022-01-21 | Disposition: A | Discharge status: Home / Self Care | Payer: BLUE CROSS/BLUE SHIELD | Source: Ambulatory Visit | Attending: Obstetrics and Gynecology | Admitting: Obstetrics and Gynecology

## 2022-01-21 ENCOUNTER — Ambulatory Visit
Admission: RE | Admit: 2022-01-21 | Discharge: 2022-01-21 | Disposition: A | Discharge status: Home / Self Care | Payer: BC Managed Care – PPO | Source: Ambulatory Visit | Attending: Obstetrics and Gynecology | Admitting: Obstetrics and Gynecology

## 2022-01-21 DIAGNOSIS — N6489 Other specified disorders of breast: Secondary | ICD-10-CM | POA: Diagnosis not present

## 2022-01-21 DIAGNOSIS — R922 Inconclusive mammogram: Secondary | ICD-10-CM | POA: Diagnosis not present

## 2022-01-21 DIAGNOSIS — R928 Other abnormal and inconclusive findings on diagnostic imaging of breast: Secondary | ICD-10-CM

## 2022-07-11 ENCOUNTER — Telehealth: Payer: BC Managed Care – PPO | Admitting: Nurse Practitioner

## 2022-07-11 DIAGNOSIS — M545 Low back pain, unspecified: Secondary | ICD-10-CM

## 2022-07-11 MED ORDER — PREDNISONE 10 MG (21) PO TBPK: ORAL_TABLET | ORAL | 0 refills | Status: DC

## 2022-07-11 NOTE — Progress Notes (Signed)
We are sorry that you are not feeling well.  Here is how we plan to help!  Based on what you have shared with me it looks like you mostly have acute back pain.  Acute back pain is defined as musculoskeletal pain that can resolve in 1-3 weeks with conservative treatment.  I have prescribed a prednisone taper. You will need to stop the Naproxen while you are taking the prednisone. You may use Tylenol as needed for breakthrough pain.   Meds ordered this encounter  Medications   predniSONE (STERAPRED UNI-PAK 21 TAB) 10 MG (21) TBPK tablet    Sig: Take 6 tablets on day one, 5 on day two, 4 on day three, 3 on day four, 2 on day five, and 1 on day six. Take with food.    Dispense:  21 tablet    Refill:  0    Home Care Stay active.  Start with short walks on flat ground if you can.  Try to walk farther each day. Do not sit, drive or stand in one place for more than 30 minutes.  Do not stay in bed. Do not avoid exercise or work.  Activity can help your back heal faster. Be careful when you bend or lift an object.  Bend at your knees, keep the object close to you, and do not twist. Sleep on a firm mattress.  Lie on your side, and bend your knees.  If you lie on your back, put a pillow under your knees. Only take medicines as told by your doctor. Put ice on the injured area. Put ice in a plastic bag Place a towel between your skin and the bag Leave the ice on for 15-20 minutes, 3-4 times a day for the first 2-3 days. 210 After that, you can switch between ice and heat packs. Ask your doctor about back exercises or massage. Avoid feeling anxious or stressed.  Find good ways to deal with stress, such as exercise.  Get Help Right Way If: Your pain does not go away with rest or medicine. Your pain does not go away in 1 week. You have new problems. You do not feel well. The pain spreads into your legs. You cannot control when you poop (bowel movement) or pee (urinate) You feel sick to your  stomach (nauseous) or throw up (vomit) You have belly (abdominal) pain. You feel like you may pass out (faint). If you develop a fever.  Make Sure you: Understand these instructions. Will watch your condition Will get help right away if you are not doing well or get worse.  Your e-visit answers were reviewed by a board certified advanced clinical practitioner to complete your personal care plan.  Depending on the condition, your plan could have included both over the counter or prescription medications.  If there is a problem please reply  once you have received a response from your provider.  Your safety is important to [place].  If you have drug allergies check your prescription carefully.    You can use MyChart to ask questions about today's visit, request a non-urgent call back, or ask for a work or school excuse for 24 hours related to this e-Visit. If it has been greater than 24 hours you will need to follow up with your provider, or enter a new e-Visit to address those concerns.  You will get an e-mail in the next two days asking about your experience.  I hope that your e-visit has been valuable and  will speed your recovery. Thank you for using e-visits.  I spent approximately 5 minutes reviewing the patient's history, current symptoms and coordinating their plan of care today.

## 2022-11-13 ENCOUNTER — Telehealth: Payer: Self-pay | Admitting: Family

## 2022-11-13 DIAGNOSIS — M5442 Lumbago with sciatica, left side: Secondary | ICD-10-CM

## 2022-11-13 MED ORDER — BACLOFEN 10 MG PO TABS: 10.0000 mg | ORAL_TABLET | Freq: Three times a day (TID) | ORAL | 0 refills | Status: DC

## 2022-11-13 MED ORDER — NAPROXEN 500 MG PO TABS: 500.0000 mg | ORAL_TABLET | Freq: Two times a day (BID) | ORAL | 0 refills | Status: DC

## 2022-11-13 MED ORDER — PREDNISONE 10 MG (21) PO TBPK: ORAL_TABLET | ORAL | 0 refills | Status: DC

## 2022-11-13 NOTE — Progress Notes (Signed)

## 2023-01-04 ENCOUNTER — Ambulatory Visit (INDEPENDENT_AMBULATORY_CARE_PROVIDER_SITE_OTHER): Payer: BLUE CROSS/BLUE SHIELD | Admitting: Family

## 2023-01-04 ENCOUNTER — Encounter: Payer: Self-pay | Admitting: Family

## 2023-01-04 DIAGNOSIS — M722 Plantar fascial fibromatosis: Secondary | ICD-10-CM

## 2023-01-04 NOTE — Progress Notes (Signed)
Office Visit Note   Patient: Kathleen [name]           Date of Birth: [birth year]           MRN: [id number] Visit Date: 01/04/2023              Requested by: No referring provider defined for this encounter. PCP: Patient, No Pcp Per  Chief Complaint  Patient presents with   Left Foot - Pain      HPI: The patient is a 42 year old woman who presents today complaining of left-sided heel pain.  She states she has a history of plantars fasciitis this has been waxing and waning for many years now typically she is unable to ease this with orthotics using ice and anti-inflammatories however this time it has not let up as been ongoing this episode for 2 months.  Pain w with start up  Assessment & Plan: Visit Diagnoses: No diagnosis found.  Plan: Depo-Medrol injection left heel.  Patient tolerated well.  She will follow-up in the office as needed.  Discussed possibility of extracorporeal shockwave therapy with Dr. Shon Baton.  Follow-Up Instructions: No follow-ups on file.   Ortho Exam  Patient is alert, oriented, no adenopathy, well-dressed, normal affect, normal respiratory effort. On examination left foot there is no edema no erythema no dystrophic changes the foot is plantigrade she does have point tenderness to the origin of the plantar fascia on the left no pain with lateral compression of the calcaneus  Imaging: No results found. No images are attached to the encounter.  Labs: Lab Results  Component Value Date   ESRSEDRATE 3 02/19/2007   LABURIC 3.5 08/14/2015   REPTSTATUS 02/05/2009 FINAL 02/02/2009   GRAMSTAIN  02/02/2009    MODERATE WBC PRESENT, PREDOMINANTLY PMN NO SQUAMOUS EPITHELIAL CELLS SEEN NO ORGANISMS SEEN   CULT  02/02/2009    MODERATE METHICILLIN RESISTANT STAPHYLOCOCCUS AUREUS Note: RIFAMPIN AND GENTAMICIN SHOULD NOT BE USED AS SINGLE DRUGS FOR TREATMENT OF STAPH INFECTIONS. This organism DOES NOT demonstrate inducible Clindamycin resistance in vitro.  CRITICAL RESULT CALLED TO, READ BACK BY AND VERIFIED WITH: TAMARA JOHNSON  02/05/09 08:25 BY GARRS   LABORGA METHICILLIN RESISTANT STAPHYLOCOCCUS AUREUS 02/02/2009     Lab Results  Component Value Date   ALBUMIN 3.2 (L) 08/14/2015    No results found for: "MG" No results found for: "VD25OH"  No results found for: "PREALBUMIN"    Latest Ref Rng & Units 05/08/2018    5:14 AM 05/07/2018    8:20 AM 09/10/2015   10:20 AM  CBC EXTENDED  WBC 4.0 - 10.5 K/uL 10.9  10.0  10.9   RBC 3.87 - 5.11 MIL/uL 3.55  4.16  4.35   Hemoglobin 12.0 - 15.0 g/dL 9.4  78.2  95.6   HCT 21.3 - 46.0 % 28.9  34.3  35.4   Platelets 150 - 400 K/uL 185  222  240      There is no height or weight on file to calculate BMI.  Orders:  No orders of the defined types were placed in this encounter.  No orders of the defined types were placed in this encounter.    Procedures: No procedures performed  Clinical Data: No additional findings.  ROS:  All other systems negative, except as noted in the HPI. Review of Systems  Objective: Vital Signs: There were no vitals taken for this visit.  Specialty Comments:  No specialty comments available.  PMFS History: There are no problems to display  for this patient.  Past Medical History:  Diagnosis Date   AMA (advanced maternal age) multigravida 35+    Anginal pain (HCC)    Anxiety    Depression    Headache    Hx of pyelonephritis    Hx of varicella    IBS (irritable bowel syndrome)    PCOS (polycystic ovarian syndrome)    Spontaneous vaginal delivery 05/07/2018    Family History  Problem Relation Age of Onset   Asthma Maternal Aunt    Heart disease Paternal Grandmother     Past Surgical History:  Procedure Laterality Date   NO PAST SURGERIES     Social History   Occupational History   Not on file  Tobacco Use   Smoking status: Former    Packs/day: 1.00    Years: 10.00    Additional pack years: 0.00    Total pack years: 10.00    Types:  Cigarettes    Quit date: 03/01/2010    Years since quitting: 12.8   Smokeless tobacco: Never  Substance and Sexual Activity   Alcohol use: No   Drug use: Never   Sexual activity: Yes    Birth control/protection: None

## 2023-01-31 ENCOUNTER — Ambulatory Visit: Payer: Self-pay | Admitting: Orthopaedic Surgery

## 2023-02-01 DIAGNOSIS — Z01419 Encounter for gynecological examination (general) (routine) without abnormal findings: Secondary | ICD-10-CM | POA: Diagnosis not present

## 2023-02-01 DIAGNOSIS — Z124 Encounter for screening for malignant neoplasm of cervix: Secondary | ICD-10-CM | POA: Diagnosis not present

## 2023-02-01 DIAGNOSIS — Z1151 Encounter for screening for human papillomavirus (HPV): Secondary | ICD-10-CM | POA: Diagnosis not present

## 2023-02-01 DIAGNOSIS — Z1231 Encounter for screening mammogram for malignant neoplasm of breast: Secondary | ICD-10-CM | POA: Diagnosis not present

## 2023-02-07 ENCOUNTER — Other Ambulatory Visit: Payer: Self-pay | Admitting: Obstetrics and Gynecology

## 2023-02-07 DIAGNOSIS — R928 Other abnormal and inconclusive findings on diagnostic imaging of breast: Secondary | ICD-10-CM

## 2023-02-13 ENCOUNTER — Ambulatory Visit: Admission: RE | Admit: 2023-02-13 | Payer: BLUE CROSS/BLUE SHIELD | Source: Ambulatory Visit

## 2023-02-13 ENCOUNTER — Ambulatory Visit
Admission: RE | Admit: 2023-02-13 | Discharge: 2023-02-13 | Disposition: A | Discharge status: Home / Self Care | Payer: BLUE CROSS/BLUE SHIELD | Source: Ambulatory Visit | Attending: Obstetrics and Gynecology | Admitting: Obstetrics and Gynecology

## 2023-02-13 DIAGNOSIS — R928 Other abnormal and inconclusive findings on diagnostic imaging of breast: Secondary | ICD-10-CM

## 2023-02-13 DIAGNOSIS — N6001 Solitary cyst of right breast: Secondary | ICD-10-CM | POA: Diagnosis not present

## 2023-02-13 DIAGNOSIS — N6002 Solitary cyst of left breast: Secondary | ICD-10-CM | POA: Diagnosis not present

## 2023-03-14 ENCOUNTER — Telehealth: Payer: Self-pay | Admitting: Sports Medicine

## 2023-03-14 NOTE — Telephone Encounter (Signed)
Called patient left message to return call to schedule an appointment with dr. Shon Baton for Shock Wave Therapy  per Denny Peon

## 2023-03-15 ENCOUNTER — Telehealth: Payer: Self-pay | Admitting: Family

## 2023-03-27 ENCOUNTER — Ambulatory Visit: Payer: BLUE CROSS/BLUE SHIELD | Admitting: Sports Medicine

## 2023-05-05 DIAGNOSIS — G4700 Insomnia, unspecified: Secondary | ICD-10-CM | POA: Diagnosis not present

## 2023-05-05 DIAGNOSIS — R5383 Other fatigue: Secondary | ICD-10-CM | POA: Diagnosis not present

## 2023-05-05 DIAGNOSIS — F320 Major depressive disorder, single episode, mild: Secondary | ICD-10-CM | POA: Diagnosis not present

## 2023-05-05 DIAGNOSIS — Z23 Encounter for immunization: Secondary | ICD-10-CM | POA: Diagnosis not present

## 2023-05-05 DIAGNOSIS — Z0000 Encounter for general adult medical examination without abnormal findings: Secondary | ICD-10-CM | POA: Diagnosis not present

## 2023-05-16 ENCOUNTER — Telehealth: Payer: BLUE CROSS/BLUE SHIELD | Admitting: Physician Assistant

## 2023-05-16 DIAGNOSIS — J208 Acute bronchitis due to other specified organisms: Secondary | ICD-10-CM | POA: Diagnosis not present

## 2023-05-16 MED ORDER — ALBUTEROL SULFATE HFA 108 (90 BASE) MCG/ACT IN AERS: 1.0000 | INHALATION_SPRAY | Freq: Four times a day (QID) | RESPIRATORY_TRACT | 0 refills | Status: DC | PRN

## 2023-05-16 MED ORDER — PREDNISONE 10 MG (21) PO TBPK: ORAL_TABLET | ORAL | 0 refills | Status: DC

## 2023-05-16 MED ORDER — BENZONATATE 100 MG PO CAPS
100.0000 mg | ORAL_CAPSULE | Freq: Three times a day (TID) | ORAL | 0 refills | Status: DC | PRN
Start: 2023-05-16 — End: 2024-01-06

## 2023-05-16 NOTE — Progress Notes (Signed)
E-Visit for Cough   We are sorry that you are not feeling well.  Here is how we plan to help!  Based on your presentation I believe you most likely have A cough due to a virus.  This is called viral bronchitis and is best treated by rest, plenty of fluids and control of the cough.  You may use Ibuprofen or Tylenol as directed to help your symptoms.     In addition you may use A non-prescription cough medication called Mucinex DM: take 2 tablets every 12 hours. and A prescription cough medication called Tessalon Perles 100mg . You may take 1-2 capsules every 8 hours as needed for your cough.  Prednisone 10 mg daily for 6 days (see taper instructions below)  Directions for 6 day taper: Day 1: 2 tablets before breakfast, 1 after both lunch & dinner and 2 at bedtime Day 2: 1 tab before breakfast, 1 after both lunch & dinner and 2 at bedtime Day 3: 1 tab at each meal & 1 at bedtime Day 4: 1 tab at breakfast, 1 at lunch, 1 at bedtime Day 5: 1 tab at breakfast & 1 tab at bedtime Day 6: 1 tab at breakfast  Albuterol inhaler Use 1-2 puffs every 4-6 hours as needed for shortness of breath or wheezing.  From your responses in the eVisit questionnaire you describe inflammation in the upper respiratory tract which is causing a significant cough.  This is commonly called Bronchitis and has four common causes:   Allergies Viral Infections Acid Reflux Bacterial Infection Allergies, viruses and acid reflux are treated by controlling symptoms or eliminating the cause. An example might be a cough caused by taking certain blood pressure medications. You stop the cough by changing the medication. Another example might be a cough caused by acid reflux. Controlling the reflux helps control the cough.  USE OF BRONCHODILATOR ("RESCUE") INHALERS: There is a risk from using your bronchodilator too frequently.  The risk is that over-reliance on a medication which only relaxes the muscles surrounding the breathing  tubes can reduce the effectiveness of medications prescribed to reduce swelling and congestion of the tubes themselves.  Although you feel brief relief from the bronchodilator inhaler, your asthma may actually be worsening with the tubes becoming more swollen and filled with mucus.  This can delay other crucial treatments, such as oral steroid medications. If you need to use a bronchodilator inhaler daily, several times per day, you should discuss this with your provider.  There are probably better treatments that could be used to keep your asthma under control.     HOME CARE Only take medications as instructed by your medical team. Complete the entire course of an antibiotic. Drink plenty of fluids and get plenty of rest. Avoid close contacts especially the very young and the elderly Cover your mouth if you cough or cough into your sleeve. Always remember to wash your hands A steam or ultrasonic humidifier can help congestion.   GET HELP RIGHT AWAY IF: You develop worsening fever. You become short of breath You cough up blood. Your symptoms persist after you have completed your treatment plan MAKE SURE YOU  Understand these instructions. Will watch your condition. Will get help right away if you are not doing well or get worse.    Thank you for choosing an e-visit.  Your e-visit answers were reviewed by a board certified advanced clinical practitioner to complete your personal care plan. Depending upon the condition, your plan could have included  both over the counter or prescription medications.  Please review your pharmacy choice. Make sure the pharmacy is open so you can pick up prescription now. If there is a problem, you may contact your provider through [employer] and have the prescription routed to another pharmacy.  Your safety is important to [place]. If you have drug allergies check your prescription carefully.   For the next 24 hours you can use MyChart to ask questions  about today's visit, request a non-urgent call back, or ask for a work or school excuse. You will get an email in the next two days asking about your experience. I hope that your e-visit has been valuable and will speed your recovery.   I have spent 5 minutes in review of e-visit questionnaire, review and updating patient chart, medical decision making and response to patient.   Margaretann Loveless, PA-C

## 2023-08-16 ENCOUNTER — Telehealth: Payer: 59 | Admitting: Physician Assistant

## 2023-08-16 DIAGNOSIS — K219 Gastro-esophageal reflux disease without esophagitis: Secondary | ICD-10-CM

## 2023-08-16 MED ORDER — PANTOPRAZOLE SODIUM 40 MG PO TBEC
40.0000 mg | DELAYED_RELEASE_TABLET | Freq: Every day | ORAL | 0 refills | Status: DC
Start: 2023-08-16 — End: 2024-01-06

## 2023-08-16 NOTE — Progress Notes (Signed)
 E-Visit for Heartburn  We are sorry that you are not feeling well.  Here is how we plan to help!  Based on what you shared with me it looks like you most likely have Gastroesophageal Reflux Disease (GERD)  Gastroesophageal reflux disease (GERD) happens when acid from your stomach flows up into the esophagus.  When acid comes in contact with the esophagus, the acid causes sorenss (inflammation) in the esophagus.  Over time, GERD may create small holes (ulcers) in the lining of the esophagus.  I have prescribed Pantoprazole  to start once daily. You will need to schedule a follow-up with your PCP for reassessment and ongoing medication.  Your symptoms should improve in the next day or two.  You can use antacids as needed until symptoms resolve.  Call us  if your heartburn worsens, you have trouble swallowing, weight loss, spitting up blood or recurrent vomiting.  Home Care: May include lifestyle changes such as weight loss, quitting smoking and alcohol consumption Avoid foods and drinks that make your symptoms worse, such as: Caffeine or alcoholic drinks Chocolate Peppermint or mint flavorings Garlic and onions Spicy foods Citrus fruits, such as oranges, lemons, or limes Tomato-based foods such as sauce, chili, salsa and pizza Fried and fatty foods Avoid lying down for 3 hours prior to your bedtime or prior to taking a nap Eat small, frequent meals instead of a large meals Wear loose-fitting clothing.  Do not wear anything tight around your waist that causes pressure on your stomach. Raise the head of your bed 6 to 8 inches with wood blocks to help you sleep.  Extra pillows will not help.  Seek Help Right Away If: You have pain in your arms, neck, jaw, teeth or back Your pain increases or changes in intensity or duration You develop nausea, vomiting or sweating (diaphoresis) You develop shortness of breath or you faint Your vomit is green, yellow, black or looks like coffee grounds or  blood Your stool is red, bloody or black  These symptoms could be signs of other problems, such as heart disease, gastric bleeding or esophageal bleeding.  Make sure you : Understand these instructions. Will watch your condition. Will get help right away if you are not doing well or get worse.  Your e-visit answers were reviewed by a board certified advanced clinical practitioner to complete your personal care plan.  Depending on the condition, your plan could have included both over the counter or prescription medications.  If there is a problem please reply  once you have received a response from your provider.  Your safety is important to us .  If you have drug allergies check your prescription carefully.    You can use MyChart to ask questions about today's visit, request a non-urgent call back, or ask for a work or school excuse for 24 hours related to this e-Visit. If it has been greater than 24 hours you will need to follow up with your provider, or enter a new e-Visit to address those concerns.  You will get an e-mail in the next two days asking about your experience.  I hope that your e-visit has been valuable and will speed your recovery. Thank you for using e-visits.

## 2023-08-16 NOTE — Progress Notes (Signed)
 I have spent 5 minutes in review of e-visit questionnaire, review and updating patient chart, medical decision making and response to patient.   Piedad Climes, PA-C

## 2023-09-27 ENCOUNTER — Ambulatory Visit: Payer: 59 | Admitting: Family

## 2023-09-27 ENCOUNTER — Encounter: Payer: Self-pay | Admitting: Family

## 2023-09-27 DIAGNOSIS — M722 Plantar fascial fibromatosis: Secondary | ICD-10-CM

## 2023-09-27 MED ORDER — LIDOCAINE HCL 1 % IJ SOLN
2.0000 mL | INTRAMUSCULAR | Status: AC | PRN
Start: 2023-09-27 — End: 2023-09-27
  Administered 2023-09-27: 2 mL

## 2023-09-27 MED ORDER — METHYLPREDNISOLONE ACETATE 40 MG/ML IJ SUSP
40.0000 mg | INTRAMUSCULAR | Status: AC | PRN
Start: 2023-09-27 — End: 2023-09-27
  Administered 2023-09-27: 40 mg

## 2023-09-27 NOTE — Progress Notes (Signed)
 Office Visit Note   Patient: Kathleen [name]           Date of Birth: 1981/04/29           MRN: [id number] Visit Date: 09/27/2023              Requested by: Lavina Hamman, MD [address] [place],  [place] [address] PCP: Lavina Hamman, MD  Chief Complaint  Patient presents with  . Left Foot - Follow-up      HPI: The patient is a 43 year old woman who is seen today for concern of recurrence of her plantar fasciitis on the left.  She reports she had increased pain around Christmas time and has been trying to take care of this on her own with anti-inflammatories and stretching she has also got some new Brooks running shoes.  The episode has not improved at all she is currently training for half marathon and has been increasing her running  Reports Depo-Medrol injection left heel for plantar fasciitis June of last year worked well  Assessment & Plan: Visit Diagnoses: No diagnosis found.  Plan: Depo-Medrol injection left heel.  Patient tolerated well.  She will follow-up as needed.  Follow-Up Instructions: Return in about 4 weeks (around 10/25/2023), or if symptoms worsen or fail to improve.   Ortho Exam  Patient is alert, oriented, no adenopathy, well-dressed, normal affect, normal respiratory effort. On examination of the left foot there is no edema no erythema no dystrophic changes her foot is plantigrade.  She does have point tenderness to the origin of the plantar fascia on the left.  No pain with lateral compression of the calcaneus.  Imaging: No results found. No images are attached to the encounter.  Labs: Lab Results  Component Value Date   ESRSEDRATE 3 02/19/2007   LABURIC 3.5 08/14/2015   REPTSTATUS 02/05/2009 FINAL 02/02/2009   GRAMSTAIN  02/02/2009    MODERATE WBC PRESENT, PREDOMINANTLY PMN NO SQUAMOUS EPITHELIAL CELLS SEEN NO ORGANISMS SEEN   CULT  02/02/2009    MODERATE METHICILLIN RESISTANT STAPHYLOCOCCUS AUREUS Note: RIFAMPIN AND  GENTAMICIN SHOULD NOT BE USED AS SINGLE DRUGS FOR TREATMENT OF STAPH INFECTIONS. This organism DOES NOT demonstrate inducible Clindamycin resistance in vitro. CRITICAL RESULT CALLED TO, READ BACK BY AND VERIFIED WITH: TAMARA JOHNSON  02/05/09 08:25 BY GARRS   LABORGA METHICILLIN RESISTANT STAPHYLOCOCCUS AUREUS 02/02/2009     Lab Results  Component Value Date   ALBUMIN 3.2 (L) 08/14/2015    No results found for: "MG" No results found for: "VD25OH"  No results found for: "PREALBUMIN"    Latest Ref Rng & Units 05/08/2018    5:14 AM 05/07/2018    8:20 AM 09/10/2015   10:20 AM  CBC EXTENDED  WBC 4.0 - 10.5 K/uL 10.9  10.0  10.9   RBC 3.87 - 5.11 MIL/uL 3.55  4.16  4.35   Hemoglobin 12.0 - 15.0 g/dL 9.4  19.1  47.8   HCT 29.5 - 46.0 % 28.9  34.3  35.4   Platelets 150 - 400 K/uL 185  222  240      There is no height or weight on file to calculate BMI.  Orders:  No orders of the defined types were placed in this encounter.  No orders of the defined types were placed in this encounter.    Procedures: Foot Inj: left plantar fascia  Date/Time: 09/27/2023 10:10 AM  Performed by: Adonis Huguenin, NP Authorized by: Adonis Huguenin, NP  Consent Given by:  Patient Site marked: the procedure site was marked   Timeout: prior to procedure the correct patient, procedure, and site was verified   Indications:  Fasciitis and pain Condition: Plantar Fasciitis   Location: left plantar fascia muscle   Prep: patient was prepped and draped in usual sterile fashion   Needle Size:  22 G Medications:  2 mL lidocaine 1 %; 40 mg methylPREDNISolone acetate 40 MG/ML Patient Tolerance:  Patient tolerated the procedure well with no immediate complications   Clinical Data: No additional findings.  ROS:  All other systems negative, except as noted in the HPI. Review of Systems  Objective: Vital Signs: There were no vitals taken for this visit.  Specialty Comments:  No specialty comments  available.  PMFS History: There are no active problems to display for this patient.  Past Medical History:  Diagnosis Date  . AMA (advanced maternal age) multigravida 35+   . Anginal pain (HCC)   . Anxiety   . Depression   . Headache   . Hx of pyelonephritis   . Hx of varicella   . IBS (irritable bowel syndrome)   . PCOS (polycystic ovarian syndrome)   . Spontaneous vaginal delivery 05/07/2018    Family History  Problem Relation Age of Onset  . Asthma Maternal Aunt   . Heart disease Paternal Grandmother     Past Surgical History:  Procedure Laterality Date  . NO PAST SURGERIES     Social History   Occupational History  . Not on file  Tobacco Use  . Smoking status: Former    Current packs/day: 0.00    Average packs/day: 1 pack/day for 10.0 years (10.0 ttl pk-yrs)    Types: Cigarettes    Start date: 03/01/2000    Quit date: 03/01/2010    Years since quitting: 13.5  . Smokeless tobacco: Never  Substance and Sexual Activity  . Alcohol use: No  . Drug use: Never  . Sexual activity: Yes    Birth control/protection: None

## 2023-10-19 ENCOUNTER — Encounter

## 2024-01-05 ENCOUNTER — Telehealth: Admitting: Physician Assistant

## 2024-01-05 DIAGNOSIS — L700 Acne vulgaris: Secondary | ICD-10-CM | POA: Diagnosis not present

## 2024-01-06 MED ORDER — CLINDAMYCIN PHOS-BENZOYL PEROX 1.2-5 % EX GEL: 1.0000 | Freq: Two times a day (BID) | CUTANEOUS | 0 refills | Status: DC

## 2024-01-06 NOTE — Progress Notes (Signed)
 I have spent 5 minutes in review of e-visit questionnaire, review and updating patient chart, medical decision making and response to patient.   Piedad Climes, PA-C

## 2024-01-06 NOTE — Progress Notes (Signed)
 E-Visit for Acne  We are sorry that you are experiencing this issue.  Here is how we plan to help!  Based on what you shared with me it looks like you have uncomplicated acne.  Acne is a disorder of the hair follicles and oil glands (sebaceous glands). The sebaceous glands secrete oils to keep the skin moist.  When the glands get clogged, it can lead to pimples or cysts.  These cysts may become infected and leave scars. Acne is very common and normally occurs at puberty.  Acne is also inherited.  Your personal care plan consists of the following recommendations:  I recommend that you use a daily cleanser  You might try 2% topical salicylic acid pads or wipes.  Use the pads to daily cleanse your skin.  I have prescribed a topical gel with an antibiotic:  Clindamycin-benzoyl peroxide gel.  This gel should be applied to the affected areas twice a day.  Be sure to read the package insert to understand potential side effects.  If excessive dryness or peeling occurs, reduce dose frequency or concentration of the topical scrubs.  If excessive stinging or burning occurs, remove the topical gel with mild soap and water and resume at a lower dose the next day.  Remember oral antibiotics and topical acne treatments may increase your sensitivity to the sun!  HOME CARE: Do not squeeze pimples because that can often lead to infections, worse acne, and scars. Use a moisturizer that contains retinoid or fruit acids that may inhibit the development of new acne lesions. Although there is not a clear link that foods can cause acne, doctors do believe that too many sweets predispose you to skin problems.  GET HELP RIGHT AWAY IF: If your acne gets worse or is not better within 10 days. If you become depressed. If you become pregnant, discontinue medications and call your OB/GYN.  MAKE SURE YOU: Understand these instructions. Will watch your condition. Will get help right away if you are not doing well or  get worse.  Thank you for choosing an e-visit.  Your e-visit answers were reviewed by a board certified advanced clinical practitioner to complete your personal care plan. Depending upon the condition, your plan could have included both over the counter or prescription medications.  Please review your pharmacy choice. Make sure the pharmacy is open so you can pick up prescription now. If there is a problem, you may contact your provider through [employer] and have the prescription routed to another pharmacy.  Your safety is important to [place]. If you have drug allergies check your prescription carefully.   For the next 24 hours you can use MyChart to ask questions about today's visit, request a non-urgent call back, or ask for a work or school excuse. You will get an email in the next two days asking about your experience. I hope that your e-visit has been valuable and will speed your recovery.

## 2024-03-08 ENCOUNTER — Telehealth: Admitting: Family Medicine

## 2024-03-08 DIAGNOSIS — M5442 Lumbago with sciatica, left side: Secondary | ICD-10-CM | POA: Diagnosis not present

## 2024-03-08 MED ORDER — PREDNISONE 10 MG (21) PO TBPK: ORAL_TABLET | ORAL | 0 refills | Status: DC

## 2024-03-08 NOTE — Progress Notes (Signed)

## 2024-05-07 ENCOUNTER — Telehealth: Payer: Self-pay | Admitting: Pharmacist

## 2024-05-07 ENCOUNTER — Ambulatory Visit: Admission: RE | Admit: 2024-05-07 | Discharge: 2024-05-07 | Disposition: A | Source: Ambulatory Visit

## 2024-05-07 ENCOUNTER — Ambulatory Visit

## 2024-05-07 VITALS — BP 129/82 | HR 72 | Temp 98.3°F | Resp 12 | Ht 68.5 in | Wt 136.4 lb

## 2024-05-07 DIAGNOSIS — Z79899 Other long term (current) drug therapy: Secondary | ICD-10-CM

## 2024-05-07 DIAGNOSIS — M0579 Rheumatoid arthritis with rheumatoid factor of multiple sites without organ or systems involvement: Secondary | ICD-10-CM | POA: Insufficient documentation

## 2024-05-07 DIAGNOSIS — Z1159 Encounter for screening for other viral diseases: Secondary | ICD-10-CM | POA: Diagnosis not present

## 2024-05-07 MED ORDER — PREDNISONE 5 MG PO TABS: ORAL_TABLET | ORAL | 0 refills | Status: AC

## 2024-05-07 NOTE — Progress Notes (Signed)
 Pharmacy Note  Subjective: Patient presents today to [facility] for follow up office visit. Patient seen by the pharmacist for counseling on methotrexate for rheumatoid arthritis. Prior therapy includes:prednisone  and NSAIDs.  Objective: CBC    Component Value Date/Time   WBC 10.9 (H) 05/08/2018 0514   RBC 3.55 (L) 05/08/2018 0514   HGB 9.4 (L) 05/08/2018 0514   HCT 28.9 (L) 05/08/2018 0514   PLT 185 05/08/2018 0514   MCV 81.4 05/08/2018 0514   MCH 26.5 05/08/2018 0514   MCHC 32.5 05/08/2018 0514   RDW 13.9 05/08/2018 0514   LYMPHSABS 2.7 08/14/2015 1820   MONOABS 0.6 08/14/2015 1820   EOSABS 0.1 08/14/2015 1820   BASOSABS 0.0 08/14/2015 1820    CMP     Component Value Date/Time   NA 137 08/14/2015 1820   K 3.6 08/14/2015 1820   CL 108 08/14/2015 1820   CO2 19 (L) 08/14/2015 1820   GLUCOSE 111 (H) 08/14/2015 1820   BUN 9 08/14/2015 1820   CREATININE 0.58 08/14/2015 1820   CALCIUM 9.0 08/14/2015 1820   PROT 7.1 08/14/2015 1820   ALBUMIN 3.2 (L) 08/14/2015 1820   AST 22 08/14/2015 1820   ALT 12 (L) 08/14/2015 1820   ALKPHOS 137 (H) 08/14/2015 1820   BILITOT 0.3 08/14/2015 1820   GFRNONAA >60 08/14/2015 1820   GFRAA >60 08/14/2015 1820    Baseline Immunosuppressant Therapy Labs TB GOLD   Hepatitis Panel    10/16/2017   12:00 AM  Hepatitis  Hep B Surface Ag Negative         This result is from an external source.   HIV Lab Results  Component Value Date   HIV Non-reactive 10/16/2017   HIV Non-reactive 02/12/2015   Immunoglobulins   SPEP    Latest Ref Rng & Units 08/14/2015    6:20 PM  Serum Protein Electrophoresis  Total Protein 6.5 - 8.1 g/dL 7.1    Contraception: IUD  Alcohol use: currently 2-3 drinks on weekend; she plans to reduce  Assessment/Plan:   Patient was counseled on the purpose, proper use, and adverse effects of methotrexate including nausea, infection, and signs and symptoms of pneumonitis. Discussed that there is the  possibility of an increased risk of malignancy, specifically lymphomas, but it is not well understood if this increased risk is due to the medication or the disease state.  Instructed patient that medication should be held for infection and prior to surgery.  Advised patient to avoid live vaccines. Recommend annual influenza, Pneumovax 23, Prevnar 13, and Shingrix as indicated.   Reviewed instructions with patient to take methotrexate weekly along with folic acid daily.  Discussed the importance of frequent monitoring of kidney and liver function and blood counts, and provided patient with standing lab instructions.  Counseled patient to avoid NSAIDs and alcohol while on methotrexate. She plans to reduce alcohol intake. Provided patient with educational materials on methotrexate and answered all questions.   Patient voiced understanding.  Patient consented to methotrexate use.  Will upload into chart.   Educated patient on how to use a vial and syringe and reviewed injection technique with patient.  Patient was able to demonstrate proper technique for injections using vial and syringe.  Provided patient educational material regarding injection technique and storage of methotrexate.   She is a nurse so feels confident with injections  Dose of methotrexate will be 15mg  (0.68mL) subcut weekly x [redacted] weeks along with folic acid 2mg  daily. Repeat CBC/CMP in 2 weeks.  If stable, increase to 20mg  (0.64mL) subcut x [redacted] weeks along with folic acid 2mg  daily. Repeat CBC/CMP in 2 weeks then every 3 months. Prescription pending baseline labs.   Sherry Pennant, PharmD, MPH, BCPS, CPP Clinical Pharmacist [facility])

## 2024-05-07 NOTE — Progress Notes (Signed)
 Office Visit Note  Patient: Kathleen [name]             Date of Birth: [birth year]           MRN: [id number]             PCP: Barbra Odor, NP Referring: Barbra Odor, NP Visit Date: 05/07/2024  Subjective:  New Patient (Initial Visit) (Total Body Pain. Abnormal Labs. )  History of Present Illness: Kathleen [name] is a 43 y.o. female   Discussed the use of AI scribe software for clinical note transcription with the patient, who gave verbal consent to proceed.  History of Present Illness Kathleen [name] is a 43 year old female who presents with joint pain and stiffness. She was referred by her primary care physician after blood work indicated a possible rheumatologic condition.  She has been experiencing joint pain since mid-July, initially accompanied by generalized illness-like symptoms. The joint pain initially improved with a course of steroids but subsequently returned. The pain primarily affects her feet, particularly the fourth toe, which she describes as feeling 'like it's broken' and is swollen. She also experiences pain in her hips, especially after prolonged sitting, and in her hands where her knuckles are swollen. Morning stiffness in her hands and feet lasts about ten minutes and improves with movement where she can get around and do things. She does feel like the stiffness probably lasts a little longer but she is able to do what she needs to do.  No recent sore throat, rashes, or past similar episodes. She experiences dry eyes and mouth, with occasional swelling of the whites of her eyes, which improves with allergy eye drops.  Her past medical history includes sciatica, which flares up occasionally and is managed with chiropractic care. She also has a history of tendinitis in her shoulders and plantar fasciitis, for which she has received injections.  She takes ibuprofen  for joint pain, which provides some relief, and has previously used diclofenac with good  effect. She quit smoking in 2011 and drinks alcohol occasionally on weekends. She runs daily, which sometimes exacerbates muscle pain, but she attributes this to overexertion rather than her joint issues. She has run everyday for ~2 years, running multiple miles.  She has a family history of Crohn's disease on her mother's side.      Activities of Daily Living:  Patient reports morning stiffness for 10 minutes.   Patient Reports nocturnal pain.  Difficulty dressing/grooming: Denies Difficulty climbing stairs: Denies Difficulty getting out of chair: Reports Difficulty using hands for taps, buttons, cutlery, and/or writing: Reports  Review of Systems  Constitutional:  Positive for fatigue.  HENT:  Positive for mouth dryness. Negative for mouth sores.   Eyes:  Positive for dryness.  Respiratory:  Negative for shortness of breath.   Cardiovascular:  Positive for palpitations. Negative for chest pain.  Gastrointestinal:  Positive for constipation. Negative for blood in stool and diarrhea.  Endocrine: Positive for increased urination.  Genitourinary:  Negative for involuntary urination.  Musculoskeletal:  Positive for joint pain, joint pain, joint swelling, myalgias, morning stiffness, muscle tenderness and myalgias. Negative for gait problem and muscle weakness.  Skin:  Negative for color change, rash, hair loss and sensitivity to sunlight.  Allergic/Immunologic: Negative for susceptible to infections.  Neurological:  Positive for dizziness and headaches.  Hematological:  Negative for swollen glands.  Psychiatric/Behavioral:  Positive for sleep disturbance. Negative for depressed mood. The patient is nervous/anxious.  Rheum History: #N/A  PMFS History:  Patient Active Problem List   Diagnosis Date Noted   Rheumatoid arthritis involving multiple sites with positive rheumatoid factor (HCC) 05/07/2024   High risk medication use 05/07/2024    Past Medical History:  Diagnosis Date    AMA (advanced maternal age) multigravida 35+    Anginal pain    Anxiety    Depression    Headache    Hx of pyelonephritis    Hx of varicella    IBS (irritable bowel syndrome)    PCOS (polycystic ovarian syndrome)    Spontaneous vaginal delivery 05/07/2018    Family History  Problem Relation Age of Onset   Hyperlipidemia Maternal Grandmother    Hypertension Maternal Grandmother    Cancer Maternal Grandmother    Hyperlipidemia Maternal Grandfather    Hypertension Maternal Grandfather    Cancer Maternal Grandfather    Hyperlipidemia Paternal Grandmother    Hypertension Paternal Grandmother    Cancer Paternal Grandmother    Heart disease Paternal Grandmother    Hypertension Paternal Grandfather    Hyperlipidemia Paternal Grandfather    Cancer Paternal Grandfather    Asthma Maternal Aunt    Past Surgical History:  Procedure Laterality Date   NO PAST SURGERIES     Social History   Social History Narrative   Not on file   Immunization History  Administered Date(s) Administered   PFIZER(Purple Top)SARS-COV-2 Vaccination 11/02/2019     Objective: Vital Signs: BP 129/82 (BP Location: Right Arm, Patient Position: Sitting, Cuff Size: Small)   Pulse 72   Temp 98.3 F (36.8 C)   Resp 12   Ht 5' 8.5 (1.74 m)   Wt 136 lb 6.4 oz (61.9 kg)   BMI 20.44 kg/m    Physical Exam Vitals and nursing note reviewed.  HENT:     Head: Normocephalic and atraumatic.     Nose: Nose normal.  Eyes:     Conjunctiva/sclera: Conjunctivae normal.     Pupils: Pupils are equal, round, and reactive to light.  Cardiovascular:     Rate and Rhythm: Normal rate and regular rhythm.     Heart sounds: Normal heart sounds.  Pulmonary:     Effort: Pulmonary effort is normal.     Breath sounds: Normal breath sounds.  Skin:    General: Skin is warm and dry.  Neurological:     Mental Status: She is alert. Mental status is at baseline.  Psychiatric:        Mood and Affect: Mood normal.         Behavior: Behavior normal.      Musculoskeletal Exam:   CDAI Exam: CDAI Score: -- Patient Global: --; Provider Global: -- Swollen: --; Tender: -- Joint Exam 05/07/2024   No joint exam has been documented for this visit   There is currently no information documented on the homunculus. Go to the Rheumatology activity and complete the homunculus joint exam.  Investigation: No additional findings.  Imaging: No results found.  Recent Labs: Lab Results  Component Value Date   WBC 10.9 (H) 05/08/2018   HGB 9.4 (L) 05/08/2018   PLT 185 05/08/2018   NA 137 08/14/2015   K 3.6 08/14/2015   CL 108 08/14/2015   CO2 19 (L) 08/14/2015   GLUCOSE 111 (H) 08/14/2015   BUN 9 08/14/2015   CREATININE 0.58 08/14/2015   BILITOT 0.3 08/14/2015   ALKPHOS 137 (H) 08/14/2015   AST 22 08/14/2015   ALT 12 (L) 08/14/2015  PROT 7.1 08/14/2015   ALBUMIN 3.2 (L) 08/14/2015   CALCIUM 9.0 08/14/2015   GFRAA >60 08/14/2015   No results found for: ANA, RF  RF 641.4 ASO abs 255.0 CCP 37  Speciality Comments: No specialty comments available.  Procedures:  No procedures performed Allergies: Phenobarbital   Assessment / Plan:     Visit Diagnoses:   Rheumatoid arthritis involving multiple sites with positive rheumatoid factor (HCC)  Patient with 1-3 small joint involvement, high positive RF and CCP, for > 6 weeks; meeting 6/10 criteria required for diagnosis of underlying Rheumatoid Arthritis. Will obtain DG Hand 2 View Right, DG Hand 2 View Left, DG Foot 2 Views Right, DG Foot 2 Views Left today.   After discussion with patient regarding new diagnosis, mutual decision made with patient to start Methotrexate. Discussed that Methotrexate (MTX) should be taken once weekly, the same day of the week (ie every Friday, for example). Taking daily doses can be very dangerous. The most common side effects can include GI upset, hair loss, sores in the mouth, diarrhea, fatigue around the day of and  after dosing. Uncommonly, MTX can cause cirrhosis of the liver or lung problems. It can also cause blood counts to be low and can increase risk for infection. It is important to monitor labs every 3 months to identify any problems that could be developing. Folic acid, a B-vitamin should be taken every day to help prevent some of the common side effects of methotrexate (ie ulcers, hair loss). It is important to be up to date with vaccines and to notify your doctor if any infections. Patient was advised to hold medication if significantly ill or on an antibiotic. Also discussed that alcohol use should be avoided when taking MTX as this may increase the risk of liver damage.   Women should not become pregnant while taking MTX and use a reliable source of birth control such as an IUD (patient w/ IUD in place), since methotrexate can cause birth defects. Women who desire to become pregnant should speak with their rheumatologist so that this can be safely planned off of methotrexate.   QuantiFERON and hepatitis will be checked. ACR handout given to patient to review and encouraged to ask questions. Patient advised they will need blood tests for liver function, kidney function, and blood counts in one month after starting this medication, and then every three months while taking this medication.   Patient also provided with 25 day prednisone  taper to help with acute joint symptoms as initiate DMARD therapy.  #High risk medication use Starting Methotrexate for Seropositive RA, will check baseline labs today (CBC, Cr, AST, ALT) then 2 weeks after starting medication.   Orders: Orders Placed This Encounter  Procedures   DG Hand 2 View Right   DG Hand 2 View Left   DG Foot 2 Views Right   DG Foot 2 Views Left   Hepatitis B core antibody, IgM   Hepatitis B surface antigen   Hepatitis C antibody   AST   ALT   Creatinine, serum   CBC   QuantiFERON-TB Gold Plus   Meds ordered this encounter  Medications    predniSONE  (DELTASONE ) 5 MG tablet    Sig: Take 4 tablets (20 mg total) by mouth daily with breakfast for 5 days, THEN 3 tablets (15 mg total) daily with breakfast for 5 days, THEN 2 tablets (10 mg total) daily with breakfast for 5 days, THEN 1 tablet (5 mg total) daily with breakfast for  10 days.    Dispense:  55 tablet    Refill:  0    I personally spent a total of 60 minutes in the care of the patient today including preparing to see the patient, getting/reviewing separately obtained history, performing a medically appropriate exam/evaluation, counseling and educating, placing orders, referring and communicating with other health care professionals, documenting clinical information in the EHR, and independently interpreting results.  Follow-Up Instructions: No follow-ups on file.   Asberry Claw, DO

## 2024-05-07 NOTE — Telephone Encounter (Signed)
 Pending baseline labs from today, patient will be methotrexate vial/syringe new start  Dose of methotrexate will be 15mg  (0.74mL) subcut weekly x [redacted] weeks along with folic acid 2mg  daily. Repeat CBC/CMP in 2 weeks. If stable, increase to 20mg  (0.14mL) subcut x [redacted] weeks along with folic acid 2mg  daily. Repeat CBC/CMP in 2 weeks then every 3 months. Prescription pending baseline labs.   Please advise patient to reduce alcohol intake to 1 drink weekly. Avoid Tylenol  and NSAIDs.  Sherry Pennant, PharmD, MPH, BCPS, CPP Clinical Pharmacist [facility])

## 2024-05-07 NOTE — Patient Instructions (Addendum)
 Standing Labs We placed an order today for your standing lab work.   Please have your standing labs drawn in 2 weeks x 2, then every 3 months  Please have your labs drawn 2 weeks prior to your appointment so that the provider can discuss your lab results at your appointment, if possible.  Please note that you may see your imaging and lab results in MyChart before we have reviewed them. We will contact you once all results are reviewed. Please allow our office up to 72 hours to thoroughly review all of the results before contacting the office for clarification of your results.  WALK-IN LAB HOURS  Monday through Thursday from 8:00 am -12:30 pm and 1:00 pm-4:30 pm and Friday from 8:00 am-12:00 pm.  Patients with office visits requiring labs will be seen before walk-in labs.  You may encounter longer than normal wait times. Please allow additional time. Wait times may be shorter on  Monday and Thursday afternoons.  We do not book appointments for walk-in labs. We appreciate your patience and understanding with our staff.   Labs are drawn by [facility]. Please bring your co-pay at the time of your lab draw.  You may receive a bill from [facility] for your lab work.  If you wish to have your labs drawn at another location, please call the office 24 hours in advance so we can fax the orders.  The office is located at [address], [address], [place], [place] [address]   If you have any questions regarding directions or hours of operation,  please call [phone].   As a reminder, please drink plenty of water prior to coming for your lab work. Thanks!  Methotrexate Injection What is this medication? METHOTREXATE (METH oh TREX ate) treats inflammatory conditions such as arthritis and psoriasis. It works by decreasing inflammation, which can reduce pain and prevent long-term injury to the joints and skin. It may also be used to treat some types of cancer. It works by slowing down the growth of cancer  cells. This medicine may be used for other purposes; ask your health care provider or pharmacist if you have questions. What should I tell my care team before I take this medication? They need to know if you have any of these conditions: Fluid in the stomach area or lungs Frequently drink alcohol Infection or immune system problems Kidney disease Liver disease Low blood counts (white cells, platelets, or red blood cells) Lung disease Recent or ongoing radiation Recent or upcoming vaccine Stomach ulcers Ulcerative colitis An unusual or allergic reaction to methotrexate, other medications, foods, dyes, or preservatives Pregnant or trying to get pregnant Breastfeeding How should I use this medication? This medication is for infusion into a vein or for injection into muscle or into the spinal fluid (whichever applies). It is usually given in a hospital or clinic setting. In rare cases, you might get this medication at home. You will be taught how to give this medication. Use exactly as directed. Take your medication at regular intervals. Do not take your medication more often than directed. If this medication is used for arthritis or psoriasis, it should be taken weekly, NOT daily. It is important that you put your used needles and syringes in a special sharps container. Do not put them in a trash can. If you do not have a sharps container, call your pharmacist or care team to get one. Talk to your care team about the use of this medication in children. While  this medication may be prescribed for children as young as 2 years for selected conditions, precautions do apply. Overdosage: If you think you have taken too much of this medicine contact a poison control center or emergency room at once. NOTE: This medicine is only for you. Do not share this medicine with others. What if I miss a dose? It is important not to miss your dose. Call your care team if you are unable to keep an appointment. If  you give yourself the medication, and you miss a dose, talk with your care team. Do not take double or extra doses. What may interact with this medication? Do not take this medication with any of the following: Acitretin Probenecid This medication may also interact with the following: Aspirin or aspirin-like medications Azathioprine Certain antibiotics, such as gentamicin, penicillin , tetracycline, vancomycin Certain medications that treat or prevent blood clots, such as warfarin, apixaban, dabigatran, rivaroxaban Certain medications for stomach problems, such as esomeprazole, omeprazole, pantoprazole  Dapsone Hydroxychloroquine Live virus vaccines Medications for viral infections, such as acyclovir, cidofovir, foscarnet, ganciclovir Mercaptopurine NSAIDs, medications for pain and inflammation, such as ibuprofen  or naproxen  Phenytoin Pyrimethamine Retinoids, such as isotretinoin or tretinoin Sulfonamides, such as sulfasalazine or trimethoprim ; sulfamethoxazole  Theophylline This list may not describe all possible interactions. Give your health care provider a list of all the medicines, herbs, non-prescription drugs, or dietary supplements you use. Also tell them if you smoke, drink alcohol, or use illegal drugs. Some items may interact with your medicine. What should I watch for while using this medication? This medication may make you feel generally unwell. This is not uncommon as chemotherapy can affect healthy cells as well as cancer cells. Report any side effects. Continue your course of treatment even though you feel ill unless your care team tells you to stop. Your condition will be monitored carefully while you are receiving this medication. Avoid alcoholic drinks. This medication can cause serious side effects. To reduce the risk, your care team may give you other medications to take before receiving this one. Be sure to follow the directions from your care team. This medication can  make you more sensitive to the sun. Keep out of the sun. If you cannot avoid being in the sun, wear protective clothing and use sunscreen. Do not use sun lamps or tanning beds/booths. You may get drowsy or dizzy. Do not drive, use machinery, or do anything that needs mental alertness until you know how this medication affects you. Do not stand or sit up quickly, especially if you are an older patient. This reduces the risk of dizzy or fainting spells. You may need blood work while you are taking this medication. Call your care team for advice if you get a fever, chills or sore throat, or other symptoms of a cold or flu. Do not treat yourself. This medication decreases your body's ability to fight infections. Try to avoid being around people who are sick. This medication may increase your risk to bruise or bleed. Call your care team if you notice any unusual bleeding. Be careful brushing or flossing your teeth or using a toothpick because you may get an infection or bleed more easily. If you have any dental work done, tell your dentist you are receiving this medication Check with your care team if you get an attack of severe diarrhea, nausea and vomiting, or if you sweat a lot. The loss of too much body fluid can make it dangerous for you to take this medication. Talk to  your care team about your risk of cancer. You may be more at risk for certain types of cancers if you take this medication. Do not become pregnant while taking this medication or for 6 months after stopping it. Women should inform their care team if they wish to become pregnant or think they might be pregnant. Men should not father a child while taking this medication and for 3 months after stopping it. There is potential for serious harm to an unborn child. Talk to your care team for more information. Do not breast-feed an infant while taking this medication or for 1 week after stopping it. This medication may make it more difficult to get  pregnant or father a child. Talk to your care team if you are concerned about your fertility. What side effects may I notice from receiving this medication? Side effects that you should report to your care team as soon as possible: Allergic reactions--skin rash, itching, hives, swelling of the face, lips, tongue, or throat Blood clot--pain, swelling, or warmth in the leg, shortness of breath, chest pain Dry cough, shortness of breath or trouble breathing Infection--fever, chills, cough, sore throat, wounds that don't heal, pain or trouble when passing urine, general feeling of discomfort or being unwell Kidney injury--decrease in the amount of urine, swelling of the ankles, hands, or feet Liver injury--right upper belly pain, loss of appetite, nausea, light-colored stool, dark yellow or brown urine, yellowing of the skin or eyes, unusual weakness or fatigue Low red blood cell count--unusual weakness or fatigue, dizziness, headache, trouble breathing Redness, blistering, peeling, or loosening of the skin, including inside the mouth Seizures Unusual bruising or bleeding Side effects that usually do not require medical attention (report to your care team if they continue or are bothersome): Diarrhea Dizziness Hair loss Nausea Pain, redness, or swelling with sores inside the mouth or throat Vomiting This list may not describe all possible side effects. Call your doctor for medical advice about side effects. You may report side effects to FDA at 1-800-FDA-1088. Where should I keep my medication? This medication is given in a hospital or clinic. It will not be stored at home. NOTE: This sheet is a summary. It may not cover all possible information. If you have questions about this medicine, talk to your doctor, pharmacist, or health care provider.  2024 [employer]vier/Gold Standard (2022-12-21 00:00:00)

## 2024-05-09 LAB — QUANTIFERON-TB GOLD PLUS
Mitogen-NIL: 7.88 [IU]/mL
NIL: 0.01 [IU]/mL
QuantiFERON-TB Gold Plus: NEGATIVE
TB1-NIL: 0.01 [IU]/mL
TB2-NIL: 0.01 [IU]/mL

## 2024-05-09 LAB — CBC
HCT: 39.6 % (ref 35.0–45.0)
Hemoglobin: 13.1 g/dL (ref 11.7–15.5)
MCH: 29.8 pg (ref 27.0–33.0)
MCHC: 33.1 g/dL (ref 32.0–36.0)
MCV: 90 fL (ref 80.0–100.0)
MPV: 10.9 fL (ref 7.5–12.5)
Platelets: 208 Thousand/uL (ref 140–400)
RBC: 4.4 Million/uL (ref 3.80–5.10)
RDW: 11.7 % (ref 11.0–15.0)
WBC: 6.1 Thousand/uL (ref 3.8–10.8)

## 2024-05-09 LAB — AST: AST: 25 U/L (ref 10–30)

## 2024-05-09 LAB — ALT: ALT: 15 U/L (ref 6–29)

## 2024-05-09 LAB — CREATININE, SERUM: Creat: 0.8 mg/dL (ref 0.50–0.99)

## 2024-05-10 ENCOUNTER — Other Ambulatory Visit: Payer: Self-pay | Admitting: *Deleted

## 2024-05-10 LAB — HEPATITIS B CORE ANTIBODY, IGM: Hep B C IgM: NONREACTIVE

## 2024-05-10 LAB — HEPATITIS B SURFACE ANTIGEN: Hepatitis B Surface Ag: NONREACTIVE

## 2024-05-10 LAB — HEPATITIS C ANTIBODY: Hepatitis C Ab: NONREACTIVE

## 2024-05-13 ENCOUNTER — Ambulatory Visit: Payer: Self-pay

## 2024-05-13 ENCOUNTER — Other Ambulatory Visit: Payer: Self-pay

## 2024-05-13 MED ORDER — METHOTREXATE SODIUM CHEMO INJECTION 50 MG/2ML: INTRAMUSCULAR | 0 refills | Status: DC

## 2024-05-13 MED ORDER — BD TB SYRINGE 27G X 1/2" 1 ML MISC: 12.0000 | 3 refills | Status: DC

## 2024-05-14 MED ORDER — METHOTREXATE SODIUM CHEMO INJECTION 50 MG/2ML: INTRAMUSCULAR | 0 refills | Status: AC

## 2024-05-14 MED ORDER — BD TB SYRINGE 27G X 1/2" 1 ML MISC: 12.0000 | 3 refills | Status: AC

## 2024-05-21 ENCOUNTER — Telehealth: Payer: Self-pay | Admitting: *Deleted

## 2024-05-21 NOTE — Telephone Encounter (Signed)
 Submitted a Prior Authorization request to [employer] for Methotrexate  via CoverMyMeds. Will update once we receive a response.

## 2024-05-24 MED ORDER — FOLIC ACID 1 MG PO TABS: 2.0000 mg | ORAL_TABLET | Freq: Every day | ORAL | 3 refills | Status: AC

## 2024-05-24 NOTE — Telephone Encounter (Signed)
 Received a voicemail from [employer] stating they needed clinical documentation supporting the Methotrexate. Faxed over the patient's office visit note to [phone].

## 2024-06-03 ENCOUNTER — Encounter: Payer: Self-pay | Admitting: Radiology

## 2024-07-03 NOTE — Telephone Encounter (Signed)
 Received notification from [facility] regarding a prior authorization for MTX. Authorization has been APPROVED from 07/02/2024 to 07/01/2025.

## 2024-07-13 ENCOUNTER — Telehealth: Admitting: Nurse Practitioner

## 2024-07-13 DIAGNOSIS — B9689 Other specified bacterial agents as the cause of diseases classified elsewhere: Secondary | ICD-10-CM

## 2024-07-13 DIAGNOSIS — H109 Unspecified conjunctivitis: Secondary | ICD-10-CM

## 2024-07-13 MED ORDER — POLYMYXIN B-TRIMETHOPRIM 10000-0.1 UNIT/ML-% OP SOLN: 1.0000 [drp] | OPHTHALMIC | 0 refills | Status: AC

## 2024-07-13 NOTE — Progress Notes (Signed)
 E-Visit for [employer]   We are sorry that you are not feeling well.  Here is how we plan to help!  Based on what you have shared with me it looks like you have conjunctivitis.  Conjunctivitis is a common inflammatory or infectious condition of the eye that is often referred to as pink eye.  In most cases it is contagious (viral or bacterial). However, not all conjunctivitis requires antibiotics (ex. Allergic).  We have made appropriate suggestions for you based upon your presentation.  I have prescribed Polytrim Ophthalmic drops 1-2 drops 4 times a day times 5 days  Pink eye can be highly contagious.  It is typically spread through direct contact with secretions, or contaminated objects or surfaces that one may have touched.  Strict handwashing is suggested with soap and water  is urged.  If not available, use alcohol based had sanitizer.  Avoid unnecessary touching of the eye.  If you wear contact lenses, you will need to refrain from wearing them until you see no white discharge from the eye for at least 24 hours after being on medication.  You should see symptom improvement in 1-2 days after starting the medication regimen.  Call us  if symptoms are not improved in 1-2 days.  Home Care: Wash your hands often! Do not wear your contacts until you complete your treatment plan. Avoid sharing towels, bed linen, personal items with a person who has pink eye. See attention for anyone in your home with similar symptoms.  Get Help Right Away If: Your symptoms do not improve. You develop blurred or loss of vision. Your symptoms worsen (increased discharge, pain or redness)   Thank you for choosing an e-visit.  Your e-visit answers were reviewed by a board certified advanced clinical practitioner to complete your personal care plan. Depending upon the condition, your plan could have included both over the counter or prescription medications.  Please review your pharmacy choice. Make sure the  pharmacy is open so you can pick up prescription now. If there is a problem, you may contact your provider through [employer] and have the prescription routed to another pharmacy.  Your safety is important to us . If you have drug allergies check your prescription carefully.   For the next 24 hours you can use MyChart to ask questions about today's visit, request a non-urgent call back, or ask for a work or school excuse. You will get an email in the next two days asking about your experience. I hope that your e-visit has been valuable and will speed your recovery.  I have spent 5 minutes in review of e-visit questionnaire, review and updating patient chart, medical decision making and response to patient.   Tiffanyann Deroo W Ezri Fanguy, NP

## 2024-07-28 MED ORDER — MELOXICAM 15 MG PO TABS: 15.0000 mg | ORAL_TABLET | Freq: Every day | ORAL | 0 refills | Status: AC
